# Patient Record
Sex: Female | Born: 1995 | Hispanic: Yes | Marital: Married | Smoking: Never smoker
Health system: Southern US, Community
[De-identification: ages and names within clinical notes are randomized; demographics above are authoritative.]

## PROBLEM LIST (undated history)

## (undated) ENCOUNTER — Inpatient Hospital Stay: Payer: Self-pay

## (undated) DIAGNOSIS — G43909 Migraine, unspecified, not intractable, without status migrainosus: Secondary | ICD-10-CM

## (undated) HISTORY — PX: OTHER SURGICAL HISTORY: SHX169

## (undated) HISTORY — DX: Migraine, unspecified, not intractable, without status migrainosus: G43.909

---
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
## 2014-06-30 NOTE — H&P (Signed)
   Subjective/Chief Complaint Boil/Abscess   History of Present Illness The pt is a previously healthy 19 year old female with no significant past medical history who initially presented to medical attention the day prior to admission. At that [facility] a painful lump in her right groin. She was diagnosed with a boil and treated with po Septra.  On the day of admission her symptoms became much worse. The area had swollen more and become exquisitely painful. The patient presented to after hours clinic and the decision was made to admit her for IV antibiotics and possible surgical incision and drainage.   Past History No prior hospitalizations No chronic medications NKDA   ALLERGIES:  No Known Allergies:   Family and Social History:  Family History Non-Contributory   Place of Living Home   Review of Systems:  Fever/Chills No  No temperature taken at home   Cough No   Abdominal Pain No   Diarrhea No   Nausea/Vomiting No   Tolerating Diet Yes   Medications/Allergies Reviewed Medications/Allergies reviewed   Physical Exam:  GEN well developed, well nourished   HEENT pink conjunctivae, PERRL, moist oral mucosa   NECK supple   RESP normal resp effort  clear BS   CARD regular rate  no murmur   ABD denies tenderness   GU right labia with large (approx 6cm) mass exquisitely tender to touch. Minimal induration/erythema. Unable to determine fluctance due to pts discomfort with exam.    Assessment/Admission Diagnosis Abscess-inguinal region/labia   Plan Admit for IV clindamycin Consult general surgery regarding possible I and D Warm compresses Pain control.   Electronic Signatures: Tammy SoursBailey, Scott (MD)  (Signed 16-May-14 19:36)  Authored: CHIEF COMPLAINT and HISTORY, ALLERGIES, FAMILY AND SOCIAL HISTORY, REVIEW OF SYSTEMS, PHYSICAL EXAM, ASSESSMENT AND PLAN   Last Updated: 16-May-14 19:36 by Tammy SoursBailey, Scott (MD)

## 2014-06-30 NOTE — Discharge Summary (Signed)
Dates of Admission and Diagnosis:  Date of Admission 23-Jul-2012   Date of Discharge 01-Jan-0001   Admitting Diagnosis abcess   Final Diagnosis abcess    Chief Complaint/History of Present Illness 19 yo seen in office 1  day prior to admission and diagnosed with abcess not fluctuant at the time.  Pt was placed on septra.  She presented to the office on the day of admission with worsening symptoms and was admitted for iv ab and possible surgical i&d.   Hospital Course:  Hospital Course The patient admitted and placed on IV clindamycin, spontaneous drainage was noted shortly after admission.  Surgical consult  was obtained but patient has not needed surgical drainage.  She has improved and the wound is open but no longer draining.   Condition on Discharge Satisfactory   DISCHARGE INSTRUCTIONS HOME MEDS:  Medication Reconciliation: Patient's Home Medications at Discharge:     Medication Instructions  motrin  600 mg eery 6 hours at home as needed for pain   cleocin hcl 300 mg oral capsule  1 cap(s) orally 3 times a day    PRESCRIPTIONS: ELECTRONICALLY SUBMITTED   Physician's Instructions:  Home Health? No   Treatments warm soaks tid   Home Oxygen? No   Diet Regular   Activity Limitations As tolerated   Return to Work Not Applicable   Time frame for Follow Up Appointment 1-2 weeks   Electronic Signatures: Charlton Amorarroll, Glenisha Gundry N (MD)  (Signed 18-May-14 09:56)  Authored: ADMISSION DATE AND DIAGNOSIS, CHIEF COMPLAINT/HPI, HOSPITAL COURSE, DISCHARGE INSTRUCTIONS HOME MEDS, PATIENT INSTRUCTIONS Shelva MajesticWest, Mellody LifeKathryn A Banker(RN)  (Signed 18-May-14 10:11)  Authored: ADMISSION DATE AND DIAGNOSIS, DISCHARGE INSTRUCTIONS HOME MEDS   Last Updated: 18-May-14 10:11 by Quillian QuinceWest, Kathryn A (RN)

## 2014-06-30 NOTE — Consult Note (Signed)
PATIENT NAME:  Carly Henry, Braxtyn MR#:  [id number] DATE OF BIRTH:  [birth year]  DATE OF CONSULTATION:  07/23/2012  REFERRING PHYSICIAN:  Scott A. Fredric MareBailey, MD CONSULTING PHYSICIAN:  Quentin Orealph L. Ely III, MD PRIMARY CARE PHYSICIAN: [facility].   CHIEF COMPLAINT: Groin pain with infection.   BRIEF HISTORY: Carly Henry is a 19 year old girl with a 3-day history of increasing swelling and discomfort in her right groin extending up from her perirectal area along her labia. This young woman has had previous similar problem which did not require surgical intervention and drained spontaneously. She noted  this evening that she did have a small amount of drainage from the abscess when she was being moved to the hospital bed.   PAST MEDICAL HISTORY: She has no other major medical problems.   PAST SURGICAL HISTORY: She has had no previous surgery.   MEDICATIONS: She takes no medications regularly.   ALLERGIES: Has no medical allergies.   FAMILY HISTORY: Denies any congenital or chronic conditions.   REVIEW OF SYSTEMS: Unremarkable.   PHYSICAL EXAMINATION: GENERAL: She is an alert, pleasant young woman in moderate distress from pain.  VITAL SIGNS: Stable with a blood pressure of 102/68. Heart rate is 86 and regular. She is afebrile.  HEENT: Unremarkable. No scleral icterus. No pupillary abnormalities. No facial deformities.  NECK: Supple, nontender, with a midline trachea and no adenopathy.  CHEST: Clear with no adventitious sounds. She has normal pulmonary excursion.  CARDIAC: No murmurs or gallops. She seems to be in normal sinus rhythm. ABDOMEN: Benign with no tenderness, no organomegaly. No hernias,  no masses.  RECTOVAGINAL: She does have a 5 to 6 cm erythematous soft area along the right buttock extending up toward the labia. There is a necrotic area in the center which appears to drain spontaneously. There is less fluctuance than noted earlier by the family.  EXTREMITIES:  Extremity exam was otherwise unremarkable with no deformities. Full range of motion.  PSYCHIATRIC: Mild anxiety but normal orientation.   IMPRESSION AND PLAN: We will continue her antibiotics. Nothing by mouth over midnight and  re-evaluate in the morning. If she continues to drain spontaneously, she will not need any surgical intervention, similar to her last episode. If she does have minimal drainage, then we will talk about intraoperative evaluation and possible further drainage with drain placement. This plan has been discussed with the patient and her mother and they are in agreement.   ____________________________ Carmie Endalph L. Ely III, MD rle:jm D: 07/23/2012 22:18:50 ET T: 07/23/2012 23:02:58 ET JOB#: [id number]  cc: Quentin Orealph L. Ely III, MD, <Dictator> Scott A. Fredric MareBailey, MD Quentin OreALPH L ELY MD ELECTRONICALLY SIGNED 07/25/2012 23:05

## 2015-10-11 ENCOUNTER — Ambulatory Visit
Admission: RE | Admit: 2015-10-11 | Discharge: 2015-10-11 | Disposition: A | Discharge status: Home / Self Care | Payer: BLUE CROSS/BLUE SHIELD | Source: Ambulatory Visit | Attending: Physician Assistant | Admitting: Physician Assistant

## 2015-10-11 ENCOUNTER — Other Ambulatory Visit: Payer: Self-pay | Admitting: Physician Assistant

## 2015-10-11 DIAGNOSIS — X58XXXA Exposure to other specified factors, initial encounter: Secondary | ICD-10-CM | POA: Diagnosis not present

## 2015-10-11 DIAGNOSIS — S86911A Strain of unspecified muscle(s) and tendon(s) at lower leg level, right leg, initial encounter: Secondary | ICD-10-CM

## 2016-01-25 ENCOUNTER — Ambulatory Visit (INDEPENDENT_AMBULATORY_CARE_PROVIDER_SITE_OTHER): Payer: BLUE CROSS/BLUE SHIELD | Admitting: Neurology

## 2016-01-25 ENCOUNTER — Encounter: Payer: Self-pay | Admitting: Neurology

## 2016-01-25 VITALS — BP 107/67 | HR 66 | Ht 62.0 in | Wt 180.6 lb

## 2016-01-25 DIAGNOSIS — F411 Generalized anxiety disorder: Secondary | ICD-10-CM | POA: Diagnosis not present

## 2016-01-25 DIAGNOSIS — G43709 Chronic migraine without aura, not intractable, without status migrainosus: Secondary | ICD-10-CM

## 2016-01-25 MED ORDER — SUMATRIPTAN SUCCINATE 100 MG PO TABS: 100.0000 mg | ORAL_TABLET | Freq: Once | ORAL | 12 refills | Status: DC | PRN

## 2016-01-25 MED ORDER — PROCHLORPERAZINE MALEATE 10 MG PO TABS: 10.0000 mg | ORAL_TABLET | Freq: Four times a day (QID) | ORAL | 6 refills | Status: DC | PRN

## 2016-01-25 MED ORDER — TOPIRAMATE 25 MG PO TABS: 100.0000 mg | ORAL_TABLET | Freq: Every day | ORAL | 6 refills | Status: DC
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
## 2016-01-25 NOTE — Patient Instructions (Addendum)
Remember to drink plenty of fluid, eat healthy meals and do not skip any meals. Try to eat protein with a every meal and eat a healthy snack such as fruit or nuts in between meals. Try to keep a regular sleep-wake schedule and try to exercise daily, particularly in the form of walking, 20-30 minutes a day, if you can.   As far as your medications are concerned, I would like to suggest  Topiramate; Start with one pill at bedtime, then increase to 2 pills at bedtime for one week, then increase to 3 pills for one week and then to 4 pills (100mg ) at night before bedtime At onset of headache take Imitrex(Sumatriptan) and Compazine, may repeat once in 2 hours. Please take Imitrex at the onset of your headache. If it does not improve the symptoms please take once additionally. Do not take more then 2 times in 24hrs. Do not take use more then 2 to 3 times in a week.  As far as diagnostic testing: If headaches persist may consider brain imaging  Our phone number is [phone]. We also have an after hours call service for urgent matters and there is a physician on-call for urgent questions. For any emergencies you know to call 911 or go to the nearest emergency room  Topiramate tablets What is this medicine? TOPIRAMATE (toe PYRE a mate) is used to treat seizures in adults or children with epilepsy. It is also used for the prevention of migraine headaches. This medicine may be used for other purposes; ask your health care provider or pharmacist if you have questions. COMMON BRAND NAME(S): Topamax, Topiragen What should I tell my health care provider before I take this medicine? They need to know if you have any of these conditions: -bleeding disorders -cirrhosis of the liver or liver disease -diarrhea -glaucoma -kidney stones or kidney disease -low blood counts, like low white cell, platelet, or red cell counts -lung disease like asthma, obstructive pulmonary disease, emphysema -metabolic  acidosis -on a ketogenic diet -schedule for surgery or a procedure -suicidal thoughts, plans, or attempt; a previous suicide attempt by you or a family member -an unusual or allergic reaction to topiramate, other medicines, foods, dyes, or preservatives -pregnant or trying to get pregnant -breast-feeding How should I use this medicine? Take this medicine by mouth with a glass of water. Follow the directions on the prescription label. Do not crush or chew. You may take this medicine with meals. Take your medicine at regular intervals. Do not take it more often than directed. Talk to your pediatrician regarding the use of this medicine in children. Special care may be needed. While this drug may be prescribed for children as young as 452 years of age for selected conditions, precautions do apply. Overdosage: If you think you have taken too much of this medicine contact a poison control center or emergency room at once. NOTE: This medicine is only for you. Do not share this medicine with others. What if I miss a dose? If you miss a dose, take it as soon as you can. If your next dose is to be taken in less than 6 hours, then do not take the missed dose. Take the next dose at your regular time. Do not take double or extra doses. What may interact with this medicine? Do not take this medicine with any of the following medications: -probenecid This medicine may also interact with the following medications: -acetazolamide -alcohol -amitriptyline -aspirin and aspirin-like medicines -birth control pills -certain  medicines for depression -certain medicines for seizures -certain medicines that treat or prevent blood clots like warfarin, enoxaparin, dalteparin, apixaban, dabigatran, and rivaroxaban -digoxin -hydrochlorothiazide -lithium -medicines for pain, sleep, or muscle relaxation -metformin -methazolamide -NSAIDS, medicines for pain and inflammation, like ibuprofen or  naproxen -pioglitazone -risperidone This list may not describe all possible interactions. Give your health care provider a list of all the medicines, herbs, non-prescription drugs, or dietary supplements you use. Also tell them if you smoke, drink alcohol, or use illegal drugs. Some items may interact with your medicine. What should I watch for while using this medicine? Visit your doctor or health care professional for regular checks on your progress. Do not stop taking this medicine suddenly. This increases the risk of seizures if you are using this medicine to control epilepsy. Wear a medical identification bracelet or chain to say you have epilepsy or seizures, and carry a card that lists all your medicines. This medicine can decrease sweating and increase your body temperature. Watch for signs of deceased sweating or fever, especially in children. Avoid extreme heat, hot baths, and saunas. Be careful about exercising, especially in hot weather. Contact your health care provider right away if you notice a fever or decrease in sweating. You should drink plenty of fluids while taking this medicine. If you have had kidney stones in the past, this will help to reduce your chances of forming kidney stones. If you have stomach pain, with nausea or vomiting and yellowing of your eyes or skin, call your doctor immediately. You may get drowsy, dizzy, or have blurred vision. Do not drive, use machinery, or do anything that needs mental alertness until you know how this medicine affects you. To reduce dizziness, do not sit or stand up quickly, especially if you are an older patient. Alcohol can increase drowsiness and dizziness. Avoid alcoholic drinks. If you notice blurred vision, eye pain, or other eye problems, seek medical attention at once for an eye exam. The use of this medicine may increase the chance of suicidal thoughts or actions. Pay special attention to how you are responding while on this medicine.  Any worsening of mood, or thoughts of suicide or dying should be reported to your health care professional right away. This medicine may increase the chance of developing metabolic acidosis. If left untreated, this can cause kidney stones, bone disease, or slowed growth in children. Symptoms include breathing fast, fatigue, loss of appetite, irregular heartbeat, or loss of consciousness. Call your doctor immediately if you experience any of these side effects. Also, tell your doctor about any surgery you plan on having while taking this medicine since this may increase your risk for metabolic acidosis. Birth control pills may not work properly while you are taking this medicine. Talk to your doctor about using an extra method of birth control. Women who become pregnant while using this medicine may enroll in the [place] [employer] Antiepileptic Drug Pregnancy Registry by calling [phone]. This registry collects information about the safety of antiepileptic drug use during pregnancy. What side effects may I notice from receiving this medicine? Side effects that you should report to your doctor or health care professional as soon as possible: -allergic reactions like skin rash, itching or hives, swelling of the face, lips, or tongue -decreased sweating and/or rise in body temperature -depression -difficulty breathing, fast or irregular breathing patterns -difficulty speaking -difficulty walking or controlling muscle movements -hearing impairment -redness, blistering, peeling or loosening of the skin, including inside the mouth -tingling, pain  or numbness in the hands or feet -unusual bleeding or bruising -unusually weak or tired -worsening of mood, thoughts or actions of suicide or dying Side effects that usually do not require medical attention (report to your doctor or health care professional if they continue or are bothersome): -altered taste -back pain, joint or muscle aches and  pains -diarrhea, or constipation -headache -loss of appetite -nausea -stomach upset, indigestion -tremors This list may not describe all possible side effects. Call your doctor for medical advice about side effects. You may report side effects to FDA at 1-800-FDA-1088. Where should I keep my medicine? Keep out of the reach of children. Store at room temperature between 15 and 30 degrees C (59 and 86 degrees F) in a tightly closed container. Protect from moisture. Throw away any unused medicine after the expiration date. NOTE: This sheet is a summary. It may not cover all possible information. If you have questions about this medicine, talk to your doctor, pharmacist, or health care provider.  2017 Elsevier/Gold Standard (2013-02-28 23:17:57) Naproxen; Sumatriptan tablets What is this medicine? NAPROXEN; SUMATRIPTAN (na PROS en; soo ma TRIP tan) is used to treat migraines with or without aura. An aura is a strange feeling or visual disturbance that warns you of an attack. This medicine is not used to prevent migraines. COMMON BRAND NAME(S): Treximet What should I tell my health care provider before I take this medicine? They need to know if you have any of these conditions: -angina -diabetes -heart disease -hemiplegic or basilar migraine -high blood pressure -high cholesterol -if you frequently drink alcohol containing drinks -irregular heartbeat -ischemic bowel disease -kidney disease -liver disease -low blood counts, like low white cell, platelet, or red cell counts -lung or breathing disease, like asthma -peripheral vascular disease like intermittent claudication -seizures -stomach problems -stroke -take medicines that treat or prevent blood clots -taken an MAOI like Carbex, Eldepryl, Marplan, Nardil, or Parnate in last 14 days -an unusual or allergic reaction to Naproxen; Sumatriptan, other medicines, foods, dyes, or preservatives -pregnant or trying to get  pregnant -breast-feeding How should I use this medicine? Take this medicine by mouth with a glass of water. Follow the directions on the prescription label. You can take it with or without food. If it upsets your stomach, take it with food. Do not cut, crush, or chew this medicine. Do not take it more often than directed. A special MedGuide will be given to you by the pharmacist with each prescription and refill. Be sure to read this information carefully each time. Talk to your pediatrician regarding the use of this medicine in children. While this drug may be prescribed for children as young as 12 for selected conditions, precautions do apply. What if I miss a dose? This does not apply; this medicine is not for regular use. What may interact with this medicine? What drug(s) may interact with Naproxen; Sumatriptan? Do not take this medicine with any of the following medications: -amphetamine -cidofovir -cocaine -dextroamphetamine -ergot alkaloids like dihydroergotamine, ergonovine, ergotamine, methylergonovine -feverfew -ketorolac -MAOIs like Carbex, Eldepryl, Marplan, Nardil, and Parnate -medicines for migraine headache like almotriptan, eletriptan, frovatriptan, naratriptan, rizatriptan, sumatriptan, zolmitriptan -tryptophan This medicine may also interact with the following medications: -alcohol -angiotensin-converting enzyme inhibitors (ACE inhibitors) like captopril, enalapril, lisinopril, and ramipril -aspirin and aspirin-like medicines -diuretics -duloxetine -lithium -medicines for blood pressure like amlodipine, felodipine, nifedipine -medicines that treat or prevent blood clots like warfarin, enoxaparin, and dalteparin -methotrexate -NSAIDS, medicines for pain and inflammation, like ibuprofen or naproxen -  probenecid -selective serotonin reuptake inhibitors such as citalopram, escitalopram, fluoxetine, fluvoxamine, paroxetine, and sertraline -steroid medicines like  prednisone or cortisone -venlafaxine What should I watch for while using this medicine? Only use this medicine for a migraine headache. Use it if you get warning symptoms or at the start of a migraine attack. This medicine is not for regular use to prevent migraine headaches. Tell your doctor or health care professional if your pain does not get better. Talk to your doctor before taking another medicine for pain. Do not treat yourself. You may get drowsy or dizzy. Do not drive, use machinery, or do anything that needs mental alertness until you know how this medicine affects you. Do not stand or sit up quickly, especially if you are an older patient. This reduces the risk of dizzy or fainting spells. Alcohol may interfere with the effect of this medicine. Avoid alcoholic drinks. This medicine may increase your risk to bruise or bleed. Call your doctor or health care professional if you notice any unusual bleeding. Do not take other medicines that contain aspirin, ibuprofen, or naproxen with this medicine. Side effects such as stomach upset, nausea, or ulcers may be more likely to occur. Many medicines available without a prescription should not be taken with this medicine. This medicine does not prevent heart attack or stroke. In fact, this medicine may increase the chance of a heart attack or stroke. The chance may increase with longer use of this medicine and in people who have heart disease. If you take aspirin to prevent heart attack or stroke, talk with your doctor or health care professional. This medicine can cause ulcers and bleeding in the stomach and intestines at any time during treatment. Do not smoke cigarettes or drink alcohol. These increase irritation to your stomach and can make it more susceptible to damage from this medicine. Ulcers and bleeding can happen without warning symptoms and can cause death. This medicine can cause you to bleed more easily. Try to avoid damage to your teeth and  gums when you brush or floss your teeth. If you have any dental work done, tell your dentist you are receiving this medicine. If you take migraine medicines for 10 or more days a month, your migraines may get worse. Keep a diary of headache days and medicine use. Contact your healthcare professional if your migraine attacks occur more frequently. What side effects may I notice from receiving this medicine? Side effects that you should report to your doctor or health care professional as soon as possible: -allergic reactions like skin rash, itching or hives, swelling of the face, lips, or tongue -fast, irregular heartbeat -feeling of chest heaviness or pressure -general ill feeling or flu-like symptoms -redness, blistering, peeling or loosening of the skin, including inside the mouth -seizures -signs and symptoms of bleeding such as bloody or black, tarry stools; red or dark-brown urine; spitting up blood or brown material that looks like coffee grounds; red spots on the skin; unusual bruising or bleeding from the eye, gums, or nose -signs and symptoms of a blood clot such as breathing problems; changes in vision; chest pain; severe, sudden headache; pain, swelling, warmth in the leg; trouble speaking; sudden numbness or weakness of the face, arm or leg -swelling of the ankles, feet, hands -severe stomach pain -tingling, pain, or numbness in the face, hands or feet -unusually weak or tired -yellowing of eyes or skin Side effects that usually do not require medical attention (report to your doctor or health  care professional if they continue or are bothersome): -dry mouth -headache -heartburn -nausea, vomiting Where should I keep my medicine? Keep out of the reach of children. Store at room temperature between 15 and 30 degrees C (59 and 86 degrees F). Throw away any unused medicine after the expiration date.  2017 Elsevier/Gold Standard (2013-07-28 14:55:46) Prochlorperazine tablets What  is this medicine? PROCHLORPERAZINE (proe klor PER a zeen) helps to control severe nausea and vomiting. This medicine is also used to treat schizophrenia. It can also help patients who experience anxiety that is not due to psychological illness. This medicine may be used for other purposes; ask your health care provider or pharmacist if you have questions. COMMON BRAND NAME(S): Compazine What should I tell my health care provider before I take this medicine? They need to know if you have any of these conditions: -blood disorders or disease -dementia -liver disease or jaundice -Parkinson's disease -uncontrollable movement disorder -an unusual or allergic reaction to prochlorperazine, other medicines, foods, dyes, or preservatives -pregnant or trying to get pregnant -breast-feeding How should I use this medicine? Take this medicine by mouth with a glass of water. Follow the directions on the prescription label. Take your doses at regular intervals. Do not take your medicine more often than directed. Do not stop taking this medicine suddenly. This can cause nausea, vomiting, and dizziness. Ask your doctor or health care professional for advice. Talk to your pediatrician regarding the use of this medicine in children. Special care may be needed. While this drug may be prescribed for children as young as 2 years for selected conditions, precautions do apply. Overdosage: If you think you have taken too much of this medicine contact a poison control center or emergency room at once. NOTE: This medicine is only for you. Do not share this medicine with others. What if I miss a dose? If you miss a dose, take it as soon as you can. If it is almost time for your next dose, take only that dose. Do not take double or extra doses. What may interact with this medicine? Do not take this medicine with any of the following medications: -amoxapine -antidepressants like citalopram, escitalopram, fluoxetine,  paroxetine, and sertraline -deferoxamine -dofetilide -maprotiline -tricyclic antidepressants like amitriptyline, clomipramine, imipramine, nortiptyline and others This medicine may also interact with the following medications: -lithium -medicines for pain -phenytoin -propranolol -warfarin This list may not describe all possible interactions. Give your health care provider a list of all the medicines, herbs, non-prescription drugs, or dietary supplements you use. Also tell them if you smoke, drink alcohol, or use illegal drugs. Some items may interact with your medicine. What should I watch for while using this medicine? Visit your doctor or health care professional for regular checks on your progress. You may get drowsy or dizzy. Do not drive, use machinery, or do anything that needs mental alertness until you know how this medicine affects you. Do not stand or sit up quickly, especially if you are an older patient. This reduces the risk of dizzy or fainting spells. Alcohol may interfere with the effect of this medicine. Avoid alcoholic drinks. This medicine can reduce the response of your body to heat or cold. Dress warm in cold weather and stay hydrated in hot weather. If possible, avoid extreme temperatures like saunas, hot tubs, very hot or cold showers, or activities that can cause dehydration such as vigorous exercise. This medicine can make you more sensitive to the sun. Keep out of the sun. If  you cannot avoid being in the sun, wear protective clothing and use sunscreen. Do not use sun lamps or tanning beds/booths. Your mouth may get dry. Chewing sugarless gum or sucking hard candy, and drinking plenty of water may help. Contact your doctor if the problem does not go away or is severe. What side effects may I notice from receiving this medicine? Side effects that you should report to your doctor or health care professional as soon as possible: -blurred vision -breast enlargement in men  or women -breast milk in women who are not breast-feeding -chest pain, fast or irregular heartbeat -confusion, restlessness -dark yellow or brown urine -difficulty breathing or swallowing -dizziness or fainting spells -drooling, shaking, movement difficulty (shuffling walk) or rigidity -fever, chills, sore throat -involuntary or uncontrollable movements of the eyes, mouth, head, arms, and legs -seizures -stomach area pain -unusually weak or tired -unusual bleeding or bruising -yellowing of skin or eyes Side effects that usually do not require medical attention (report to your doctor or health care professional if they continue or are bothersome): -difficulty passing urine -difficulty sleeping -headache -sexual dysfunction -skin rash, or itching This list may not describe all possible side effects. Call your doctor for medical advice about side effects. You may report side effects to FDA at 1-800-FDA-1088. Where should I keep my medicine? Keep out of the reach of children. Store at room temperature between 15 and 30 degrees C (59 and 86 degrees F). Protect from light. Throw away any unused medicine after the expiration date. NOTE: This sheet is a summary. It may not cover all possible information. If you have questions about this medicine, talk to your doctor, pharmacist, or health care provider.  2017 Elsevier/Gold Standard (2011-07-15 16:59:39)

## 2016-01-26 ENCOUNTER — Encounter: Payer: Self-pay | Admitting: Neurology

## 2016-01-26 DIAGNOSIS — F411 Generalized anxiety disorder: Secondary | ICD-10-CM | POA: Insufficient documentation

## 2016-01-26 DIAGNOSIS — G43709 Chronic migraine without aura, not intractable, without status migrainosus: Secondary | ICD-10-CM | POA: Insufficient documentation

## 2016-05-01 ENCOUNTER — Ambulatory Visit: Payer: BLUE CROSS/BLUE SHIELD | Admitting: Adult Health

## 2016-05-05 ENCOUNTER — Encounter: Payer: Self-pay | Admitting: Adult Health

## 2016-09-16 ENCOUNTER — Ambulatory Visit (INDEPENDENT_AMBULATORY_CARE_PROVIDER_SITE_OTHER): Payer: BLUE CROSS/BLUE SHIELD | Admitting: Certified Nurse Midwife

## 2016-09-16 ENCOUNTER — Encounter: Payer: Self-pay | Admitting: Certified Nurse Midwife

## 2016-09-16 VITALS — BP 114/73 | HR 77 | Ht 61.5 in | Wt 189.5 lb

## 2016-09-16 DIAGNOSIS — N926 Irregular menstruation, unspecified: Secondary | ICD-10-CM

## 2016-09-16 DIAGNOSIS — Z3201 Encounter for pregnancy test, result positive: Secondary | ICD-10-CM

## 2016-09-16 LAB — POCT URINE PREGNANCY: Preg Test, Ur: POSITIVE — AB

## 2016-09-16 NOTE — Patient Instructions (Signed)
Common Medications Safe in Pregnancy  Acne:      Constipation:  Benzoyl Peroxide     Colace  Clindamycin      Dulcolax Suppository  Topica Erythromycin     Fibercon  Salicylic Acid      Metamucil         Miralax AVOID:        Senakot   Accutane    Cough:  Retin-A       Cough Drops  Tetracycline      Phenergan w/ Codeine if Rx  Minocycline      Robitussin (Plain & DM)  Antibiotics:     Crabs/Lice:  Ceclor       RID  Cephalosporins    AVOID:  E-Mycins      Kwell  Keflex  Macrobid/Macrodantin   Diarrhea:  Penicillin      Kao-Pectate  Zithromax      Imodium AD         PUSH FLUIDS AVOID:       Cipro     Fever:  Tetracycline      Tylenol (Regular or Extra  Minocycline       Strength)  Levaquin      Extra Strength-Do not          Exceed 8 tabs/24 hrs Caffeine:        <200mg/day (equiv. To 1 cup of coffee or  approx. 3 12 oz sodas)         Gas: Cold/Hayfever:       Gas-X  Benadryl      Mylicon  Claritin       Phazyme  **Claritin-D        Chlor-Trimeton    Headaches:  Dimetapp      ASA-Free Excedrin  Drixoral-Non-Drowsy     Cold Compress  Mucinex (Guaifenasin)     Tylenol (Regular or Extra  Sudafed/Sudafed-12 Hour     Strength)  **Sudafed PE Pseudoephedrine   Tylenol Cold & Sinus     Vicks Vapor Rub  Zyrtec  **AVOID if Problems With Blood Pressure         Heartburn: Avoid lying down for at least 1 hour after meals  Aciphex      Maalox     Rash:  Milk of Magnesia     Benadryl    Mylanta       1% Hydrocortisone Cream  Pepcid  Pepcid Complete   Sleep Aids:  Prevacid      Ambien   Prilosec       Benadryl  Rolaids       Chamomile Tea  Tums (Limit 4/day)     Unisom  Zantac       Tylenol PM         Warm milk-add vanilla or  Hemorrhoids:       Sugar for taste  Anusol/Anusol H.C.  (RX: Analapram 2.5%)  Sugar Substitutes:  Hydrocortisone OTC     Ok in moderation  Preparation H      Tucks        Vaseline lotion applied to tissue with  wiping    Herpes:     Throat:  Acyclovir      Oragel  Famvir  Valtrex     Vaccines:         Flu Shot Leg Cramps:       *Gardasil  Benadryl      Hepatitis A         Hepatitis B Nasal Spray:         Pneumovax  Saline Nasal Spray     Polio Booster         Tetanus Nausea:       Tuberculosis test or PPD  Vitamin B6 25 mg TID   AVOID:    Dramamine      *Gardasil  Emetrol       Live Poliovirus  Ginger Root 250 mg QID    MMR (measles, mumps &  High Complex Carbs @ Bedtime    rebella)  Sea Bands-Accupressure    Varicella (Chickenpox)  Unisom 1/2 tab TID     *No known complications           If received before Pain:         Known pregnancy;   Darvocet       Resume series after  Lortab        Delivery  Percocet    Yeast:   Tramadol      Femstat  Tylenol 3      Gyne-lotrimin  Ultram       Monistat  Vicodin           MISC:         All Sunscreens           Hair Coloring/highlights          Insect Repellant's          (Including DEET)         Mystic Tans WHAT OB PATIENTS CAN EXPECT   Confirmation of pregnancy and ultrasound ordered if medically indicated-[redacted] weeks gestation  New OB (NOB) intake with nurse and New OB (NOB) labs- [redacted] weeks gestation  New OB (NOB) physical examination with provider- 11/[redacted] weeks gestation  Flu vaccine-[redacted] weeks gestation  Anatomy scan-[redacted] weeks gestation  Glucose tolerance test, blood work to test for anemia, T-dap vaccine-[redacted] weeks gestation  Vaginal swabs/cultures-STD/Group B strep-[redacted] weeks gestation  Appointments every 4 weeks until 28 weeks  Every 2 weeks from 28 weeks until 36 weeks  Weekly visits from 36 weeks until delivery  Prenatal Care WHAT IS PRENATAL CARE? Prenatal care is the process of caring for a pregnant woman before she gives birth. Prenatal care makes sure that she and her baby remain as healthy as possible throughout pregnancy. Prenatal care may be provided by a midwife, family practice health care provider, or a childbirth and  pregnancy specialist (obstetrician). Prenatal care may include physical examinations, testing, treatments, and education on nutrition, lifestyle, and social support services. WHY IS PRENATAL CARE SO IMPORTANT? Early and consistent prenatal care increases the chance that you and your baby will remain healthy throughout your pregnancy. This type of care also decreases a baby's risk of being born too early (prematurely), or being born smaller than expected (small for gestational age). Any underlying medical conditions you may have that could pose a risk during your pregnancy are discussed during prenatal care visits. You will also be monitored regularly for any new conditions that may arise during your pregnancy so they can be treated quickly and effectively. WHAT HAPPENS DURING PRENATAL CARE VISITS? Prenatal care visits may include the following: Discussion Tell your health care provider about any new signs or symptoms you have experienced since your last visit. These might include:  Nausea or vomiting.  Increased or decreased level of energy.  Difficulty sleeping.  Back or leg pain.  Weight changes.  Frequent urination.  Shortness of breath with physical activity.  Changes in your skin, such as the development of a rash  or itchiness.  Vaginal discharge or bleeding.  Feelings of excitement or nervousness.  Changes in your baby's movements.  You may want to write down any questions or topics you want to discuss with your health care provider and bring them with you to your appointment. Examination During your first prenatal care visit, you will likely have a complete physical exam. Your health care provider will often examine your vagina, cervix, and the position of your uterus, as well as check your heart, lungs, and other body systems. As your pregnancy progresses, your health care provider will measure the size of your uterus and your baby's position inside your uterus. He or she may  also examine you for early signs of labor. Your prenatal visits may also include checking your blood pressure and, after about 10-12 weeks of pregnancy, listening to your baby's heartbeat. Testing Regular testing often includes:  Urinalysis. This checks your urine for glucose, protein, or signs of infection.  Blood count. This checks the levels of white and red blood cells in your body.  Tests for sexually transmitted infections (STIs). Testing for STIs at the beginning of pregnancy is routinely done and is required in many states.  Antibody testing. You will be checked to see if you are immune to certain illnesses, such as rubella, that can affect a developing fetus.  Glucose screen. Around 24-28 weeks of pregnancy, your blood glucose level will be checked for signs of gestational diabetes. Follow-up tests may be recommended.  Group B strep. This is a bacteria that is commonly found inside a woman's vagina. This test will inform your health care provider if you need an antibiotic to reduce the amount of this bacteria in your body prior to labor and childbirth.  Ultrasound. Many pregnant women undergo an ultrasound screening around 18-20 weeks of pregnancy to evaluate the health of the fetus and check for any developmental abnormalities.  HIV (human immunodeficiency virus) testing. Early in your pregnancy, you will be screened for HIV. If you are at high risk for HIV, this test may be repeated during your third trimester of pregnancy.  You may be offered other testing based on your age, personal or family medical history, or other factors. HOW OFTEN SHOULD I PLAN TO SEE MY HEALTH CARE PROVIDER FOR PRENATAL CARE? Your prenatal care check-up schedule depends on any medical conditions you have before, or develop during, your pregnancy. If you do not have any underlying medical conditions, you will likely be seen for checkups:  Monthly, during the first 6 months of pregnancy.  Twice a month  during months 7 and 8 of pregnancy.  Weekly starting in the 9th month of pregnancy and until delivery.  If you develop signs of early labor or other concerning signs or symptoms, you may need to see your health care provider more often. Ask your health care provider what prenatal care schedule is best for you. WHAT CAN I DO TO KEEP MYSELF AND MY BABY AS HEALTHY AS POSSIBLE DURING MY PREGNANCY?  Take a prenatal vitamin containing 400 micrograms (0.4 mg) of folic acid every day. Your health care provider may also ask you to take additional vitamins such as iodine, vitamin D, iron, copper, and zinc.  Take 1500-2000 mg of calcium daily starting at your 20th week of pregnancy until you deliver your baby.  Make sure you are up to date on your vaccinations. Unless directed otherwise by your health care provider: ? You should receive a tetanus, diphtheria, and pertussis (Tdap) vaccination  between the 27th and 36th week of your pregnancy, regardless of when your last Tdap immunization occurred. This helps protect your baby from whooping cough (pertussis) after he or she is born. ? You should receive an annual inactivated influenza vaccine (IIV) to help protect you and your baby from influenza. This can be done at any point during your pregnancy.  Eat a well-rounded diet that includes: ? Fresh fruits and vegetables. ? Lean proteins. ? Calcium-rich foods such as milk, yogurt, hard cheeses, and dark, leafy greens. ? Whole grain breads.  Do noteat seafood high in mercury, including: ? Swordfish. ? Tilefish. ? Shark. ? King mackerel. ? More than 6 oz tuna per week.  Do not eat: ? Raw or undercooked meats or eggs. ? Unpasteurized foods, such as soft cheeses (brie, blue, or feta), juices, and milks. ? Lunch meats. ? Hot dogs that have not been heated until they are steaming.  Drink enough water to keep your urine clear or pale yellow. For many women, this may be 10 or more 8 oz glasses of water  each day. Keeping yourself hydrated helps deliver nutrients to your baby and may prevent the start of pre-term uterine contractions.  Do not use any tobacco products including cigarettes, chewing tobacco, or electronic cigarettes. If you need help quitting, ask your health care provider.  Do not drink beverages containing alcohol. No safe level of alcohol consumption during pregnancy has been determined.  Do not use any illegal drugs. These can harm your developing baby or cause a miscarriage.  Ask your health care provider or pharmacist before taking any prescription or over-the-counter medicines, herbs, or supplements.  Limit your caffeine intake to no more than 200 mg per day.  Exercise. Unless told otherwise by your health care provider, try to get 30 minutes of moderate exercise most days of the week. Do not  do high-impact activities, contact sports, or activities with a high risk of falling, such as horseback riding or downhill skiing.  Get plenty of rest.  Avoid anything that raises your body temperature, such as hot tubs and saunas.  If you own a cat, do not empty its litter box. Bacteria contained in cat feces can cause an infection called toxoplasmosis. This can result in serious harm to the fetus.  Stay away from chemicals such as insecticides, lead, mercury, and cleaning or paint products that contain solvents.  Do not have any X-rays taken unless medically necessary.  Take a childbirth and breastfeeding preparation class. Ask your health care provider if you need a referral or recommendation.  This information is not intended to replace advice given to you by your health care provider. Make sure you discuss any questions you have with your health care provider. Document Released: 02/27/2003 Document Revised: 07/30/2015 Document Reviewed: 05/11/2013 Elsevier Interactive Patient Education  2017 [employer]. Eating Plan for Pregnant Women While you are pregnant, your body  will require additional nutrition to help support your growing baby. It is recommended that you consume:  150 additional calories each day during your first trimester.  300 additional calories each day during your second trimester.  300 additional calories each day during your third trimester.  Eating a healthy, well-balanced diet is very important for your health and for your baby's health. You also have a higher need for some vitamins and minerals, such as folic acid, calcium, iron, and vitamin D. What do I need to know about eating during pregnancy?  Do not try to lose weight or  go on a diet during pregnancy.  Choose healthy, nutritious foods. Choose  of a sandwich with a glass of milk instead of a candy bar or a high-calorie sugar-sweetened beverage.  Limit your overall intake of foods that have "empty calories." These are foods that have little nutritional value, such as sweets, desserts, candies, sugar-sweetened beverages, and fried foods.  Eat a variety of foods, especially fruits and vegetables.  Take a prenatal vitamin to help meet the additional needs during pregnancy, specifically for folic acid, iron, calcium, and vitamin D.  Remember to stay active. Ask your health care provider for exercise recommendations that are specific to you.  Practice good food safety and cleanliness, such as washing your hands before you eat and after you prepare raw meat. This helps to prevent foodborne illnesses, such as listeriosis, that can be very dangerous for your baby. Ask your health care provider for more information about listeriosis. What does 150 extra calories look like? Healthy options for an additional 150 calories each day could be any of the following:  Plain low-fat yogurt (6-8 oz) with  cup of berries.  1 apple with 2 teaspoons of peanut butter.  Cut-up vegetables with  cup of hummus.  Low-fat chocolate milk (8 oz or 1 cup).  1 string cheese with 1 medium orange.   of  a peanut butter and jelly sandwich on whole-wheat bread (1 tsp of peanut butter).  For 300 calories, you could eat two of those healthy options each day. What is a healthy amount of weight to gain? The recommended amount of weight for you to gain is based on your pre-pregnancy BMI. If your pre-pregnancy BMI was:  Less than 18 (underweight), you should gain 28-40 lb.  18-24.9 (normal), you should gain 25-35 lb.  25-29.9 (overweight), you should gain 15-25 lb.  Greater than 30 (obese), you should gain 11-20 lb.  What if I am having twins or multiples? Generally, pregnant women who will be having twins or multiples may need to increase their daily calories by 300-600 calories each day. The recommended range for total weight gain is 25-54 lb, depending on your pre-pregnancy BMI. Talk with your health care provider for specific guidance about additional nutritional needs, weight gain, and exercise during your pregnancy. What foods can I eat? Grains Any grains. Try to choose whole grains, such as whole-wheat bread, oatmeal, or brown rice. Vegetables Any vegetables. Try to eat a variety of colors and types of vegetables to get a full range of vitamins and minerals. Remember to wash your vegetables well before eating. Fruits Any fruits. Try to eat a variety of colors and types of fruit to get a full range of vitamins and minerals. Remember to wash your fruits well before eating. Meats and Other Protein Sources Lean meats, including chicken, [place], fish, and lean cuts of beef, veal, or pork. Make sure that all meats are cooked to "well done." Tofu. Tempeh. Beans. Eggs. Peanut butter and other nut butters. Seafood, such as shrimp, crab, and lobster. If you choose fish, select types that are higher in omega-3 fatty acids, including salmon, herring, mussels, trout, sardines, and pollock. Make sure that all meats are cooked to food-safe temperatures. Dairy Pasteurized milk and milk alternatives.  Pasteurized yogurt and pasteurized cheese. Cottage cheese. Sour cream. Beverages Water. Juices that contain 100% fruit juice or vegetable juice. Caffeine-free teas and decaffeinated coffee. Drinks that contain caffeine are okay to drink, but it is better to avoid caffeine. Keep your total caffeine intake  to less than 200 mg each day (12 oz of coffee, tea, or soda) or as directed by your health care provider. Condiments Any pasteurized condiments. Sweets and Desserts Any sweets and desserts. Fats and Oils Any fats and oils. The items listed above may not be a complete list of recommended foods or beverages. Contact your dietitian for more options. What foods are not recommended? Vegetables Unpasteurized (raw) vegetable juices. Fruits Unpasteurized (raw) fruit juices. Meats and Other Protein Sources Cured meats that have nitrates, such as bacon, salami, and hotdogs. Luncheon meats, bologna, or other deli meats (unless they are reheated until they are steaming hot). Refrigerated pate, meat spreads from a meat counter, smoked seafood that is found in the refrigerated section of a store. Raw fish, such as sushi or sashimi. High mercury content fish, such as tilefish, shark, swordfish, and king mackerel. Raw meats, such as tuna or beef tartare. Undercooked meats and poultry. Make sure that all meats are cooked to food-safe temperatures. Dairy Unpasteurized (raw) milk and any foods that have raw milk in them. Soft cheeses, such as feta, queso blanco, queso fresco, Brie, Camembert cheeses, blue-veined cheeses, and Panela cheese (unless it is made with pasteurized milk, which must be stated on the label). Beverages Alcohol. Sugar-sweetened beverages, such as sodas, teas, or energy drinks. Condiments Homemade fermented foods and drinks, such as pickles, sauerkraut, or kombucha drinks. (Store-bought pasteurized versions of these are okay.) Other Salads that are made in the store, such as ham salad,  chicken salad, egg salad, tuna salad, and seafood salad. The items listed above may not be a complete list of foods and beverages to avoid. Contact your dietitian for more information. This information is not intended to replace advice given to you by your health care provider. Make sure you discuss any questions you have with your health care provider. Document Released: 12/09/2013 Document Revised: 08/02/2015 Document Reviewed: 08/09/2013 Elsevier Interactive Patient Education  2018 [place] of Pregnancy The first trimester of pregnancy is from week 1 until the end of week 13 (months 1 through 3). A week after a sperm fertilizes an egg, the egg will implant on the wall of the uterus. This embryo will begin to develop into a baby. Genes from you and your partner will form the baby. The female genes will determine whether the baby will be a boy or a girl. At 6-8 weeks, the eyes and face will be formed, and the heartbeat can be seen on ultrasound. At the end of 12 weeks, all the baby's organs will be formed. Now that you are pregnant, you will want to do everything you can to have a healthy baby. Two of the most important things are to get good prenatal care and to follow your health care provider's instructions. Prenatal care is all the medical care you receive before the baby's birth. This care will help prevent, find, and treat any problems during the pregnancy and childbirth. Body changes during your first trimester Your body goes through many changes during pregnancy. The changes vary from woman to woman.  You may gain or lose a couple of pounds at first.  You may feel sick to your stomach (nauseous) and you may throw up (vomit). If the vomiting is uncontrollable, call your health care provider.  You may tire easily.  You may develop headaches that can be relieved by medicines. All medicines should be approved by your health care provider.  You may urinate more often.  Painful urination may  mean you have a bladder infection.  You may develop heartburn as a result of your pregnancy.  You may develop constipation because certain hormones are causing the muscles that push stool through your intestines to slow down.  You may develop hemorrhoids or swollen veins (varicose veins).  Your breasts may begin to grow larger and become tender. Your nipples may stick out more, and the tissue that surrounds them (areola) may become darker.  Your gums may bleed and may be sensitive to brushing and flossing.  Dark spots or blotches (chloasma, mask of pregnancy) may develop on your face. This will likely fade after the baby is born.  Your menstrual periods will stop.  You may have a loss of appetite.  You may develop cravings for certain kinds of food.  You may have changes in your emotions from day to day, such as being excited to be pregnant or being concerned that something may go wrong with the pregnancy and baby.  You may have more vivid and strange dreams.  You may have changes in your hair. These can include thickening of your hair, rapid growth, and changes in texture. Some women also have hair loss during or after pregnancy, or hair that feels dry or thin. Your hair will most likely return to normal after your baby is born.  What to expect at prenatal visits During a routine prenatal visit:  You will be weighed to make sure you and the baby are growing normally.  Your blood pressure will be taken.  Your abdomen will be measured to track your baby's growth.  The fetal heartbeat will be listened to between weeks 10 and 14 of your pregnancy.  Test results from any previous visits will be discussed.  Your health care provider may ask you:  How you are feeling.  If you are feeling the baby move.  If you have had any abnormal symptoms, such as leaking fluid, bleeding, severe headaches, or abdominal cramping.  If you are using any tobacco products,  including cigarettes, chewing tobacco, and electronic cigarettes.  If you have any questions.  Other tests that may be performed during your first trimester include:  Blood tests to find your blood type and to check for the presence of any previous infections. The tests will also be used to check for low iron levels (anemia) and protein on red blood cells (Rh antibodies). Depending on your risk factors, or if you previously had diabetes during pregnancy, you may have tests to check for high blood sugar that affects pregnant women (gestational diabetes).  Urine tests to check for infections, diabetes, or protein in the urine.  An ultrasound to confirm the proper growth and development of the baby.  Fetal screens for spinal cord problems (spina bifida) and Down syndrome.  HIV (human immunodeficiency virus) testing. Routine prenatal testing includes screening for HIV, unless you choose not to have this test.  You may need other tests to make sure you and the baby are doing well.  Follow these instructions at home: Medicines  Follow your health care provider's instructions regarding medicine use. Specific medicines may be either safe or unsafe to take during pregnancy.  Take a prenatal vitamin that contains at least 600 micrograms (mcg) of folic acid.  If you develop constipation, try taking a stool softener if your health care provider approves. Eating and drinking  Eat a balanced diet that includes fresh fruits and vegetables, whole grains, good sources of protein such as meat, eggs, or tofu,  and low-fat dairy. Your health care provider will help you determine the amount of weight gain that is right for you.  Avoid raw meat and uncooked cheese. These carry germs that can cause birth defects in the baby.  Eating four or five small meals rather than three large meals a day may help relieve nausea and vomiting. If you start to feel nauseous, eating a few soda crackers can be helpful.  Drinking liquids between meals, instead of during meals, also seems to help ease nausea and vomiting.  Limit foods that are high in fat and processed sugars, such as fried and sweet foods.  To prevent constipation: ? Eat foods that are high in fiber, such as fresh fruits and vegetables, whole grains, and beans. ? Drink enough fluid to keep your urine clear or pale yellow. Activity  Exercise only as directed by your health care provider. Most women can continue their usual exercise routine during pregnancy. Try to exercise for 30 minutes at least 5 days a week. Exercising will help you: ? Control your weight. ? Stay in shape. ? Be prepared for labor and delivery.  Experiencing pain or cramping in the lower abdomen or lower back is a good sign that you should stop exercising. Check with your health care provider before continuing with normal exercises.  Try to avoid standing for long periods of time. Move your legs often if you must stand in one place for a long time.  Avoid heavy lifting.  Wear low-heeled shoes and practice good posture.  You may continue to have sex unless your health care provider tells you not to. Relieving pain and discomfort  Wear a good support bra to relieve breast tenderness.  Take warm sitz baths to soothe any pain or discomfort caused by hemorrhoids. Use hemorrhoid cream if your health care provider approves.  Rest with your legs elevated if you have leg cramps or low back pain.  If you develop varicose veins in your legs, wear support hose. Elevate your feet for 15 minutes, 3-4 times a day. Limit salt in your diet. Prenatal care  Schedule your prenatal visits by the twelfth week of pregnancy. They are usually scheduled monthly at first, then more often in the last 2 months before delivery.  Write down your questions. Take them to your prenatal visits.  Keep all your prenatal visits as told by your health care provider. This is  important. Safety  Wear your seat belt at all times when driving.  Make a list of emergency phone numbers, including numbers for family, friends, the hospital, and police and fire departments. General instructions  Ask your health care provider for a referral to a local prenatal education class. Begin classes no later than the beginning of month 6 of your pregnancy.  Ask for help if you have counseling or nutritional needs during pregnancy. Your health care provider can offer advice or refer you to specialists for help with various needs.  Do not use hot tubs, steam rooms, or saunas.  Do not douche or use tampons or scented sanitary pads.  Do not cross your legs for long periods of time.  Avoid cat litter boxes and soil used by cats. These carry germs that can cause birth defects in the baby and possibly loss of the fetus by miscarriage or stillbirth.  Avoid all smoking, herbs, alcohol, and medicines not prescribed by your health care provider. Chemicals in these products affect the formation and growth of the baby.  Do not use  any products that contain nicotine or tobacco, such as cigarettes and e-cigarettes. If you need help quitting, ask your health care provider. You may receive counseling support and other resources to help you quit.  Schedule a dentist appointment. At home, brush your teeth with a soft toothbrush and be gentle when you floss. Contact a health care provider if:  You have dizziness.  You have mild pelvic cramps, pelvic pressure, or nagging pain in the abdominal area.  You have persistent nausea, vomiting, or diarrhea.  You have a bad smelling vaginal discharge.  You have pain when you urinate.  You notice increased swelling in your face, hands, legs, or ankles.  You are exposed to fifth disease or chickenpox.  You are exposed to [place] measles (rubella) and have never had it. Get help right away if:  You have a fever.  You are leaking fluid from your  vagina.  You have spotting or bleeding from your vagina.  You have severe abdominal cramping or pain.  You have rapid weight gain or loss.  You vomit blood or material that looks like coffee grounds.  You develop a severe headache.  You have shortness of breath.  You have any kind of trauma, such as from a fall or a car accident. Summary  The first trimester of pregnancy is from week 1 until the end of week 13 (months 1 through 3).  Your body goes through many changes during pregnancy. The changes vary from woman to woman.  You will have routine prenatal visits. During those visits, your health care provider will examine you, discuss any test results you may have, and talk with you about how you are feeling. This information is not intended to replace advice given to you by your health care provider. Make sure you discuss any questions you have with your health care provider. Document Released: 02/18/2001 Document Revised: 02/06/2016 Document Reviewed: 02/06/2016 Elsevier Interactive Patient Education  2017 [employer].

## 2016-09-16 NOTE — Progress Notes (Addendum)
GYN ENCOUNTER NOTE  Subjective:       Carly Henry is a 21 y.o. G1P0 female here for pregnancy confirmation.   She reports positive home pregnancy test and endorses breast tenderness. This is a planned pregnancy.   Denies difficulty breathing or respiratory distress, chest pain, nausea and vomiting, abdominal pain, dysuria, vaginal bleeding, and leg pain or swelling.   History significant for migraines, no currently taking any medications.   Gynecologic History  Patient's last menstrual period was 08/04/2016 (exact date).  Estimated date of birth: 05/11/2018  Gestational age: 81 weeks 1 day  Contraception: none   Last Pap: N/A.   Obstetric History  OB History  Gravida Para Term Preterm AB Living  1            SAB TAB Ectopic Multiple Live Births               # Outcome Date GA Lbr Len/2nd Weight Sex Delivery Anes PTL Lv  1 Current               Past Medical History:  Diagnosis Date  . Migraine     Past Surgical History:  Procedure Laterality Date  . no surgerical history      Current Outpatient Prescriptions on File Prior to Visit  Medication Sig Dispense Refill  . ibuprofen (ADVIL,MOTRIN) 400 MG tablet Take 400 mg by mouth every 6 (six) hours as needed.    . prochlorperazine (COMPAZINE) 10 MG tablet Take 1 tablet (10 mg total) by mouth every 6 (six) hours as needed for nausea or vomiting. (Patient not taking: Reported on 09/16/2016) 30 tablet 6  . SUMAtriptan (IMITREX) 100 MG tablet Take 1 tablet (100 mg total) by mouth once as needed. May repeat in 2 hours if headache persists or recurs. (Patient not taking: Reported on 09/16/2016) 10 tablet 12  . topiramate (TOPAMAX) 25 MG tablet Take 4 tablets (100 mg total) by mouth at bedtime. (Patient not taking: Reported on 09/16/2016) 120 tablet 6   No current facility-administered medications on file prior to visit.     No Known Allergies  Social History   Social History  . Marital status: Single    Spouse  name: N/A  . Number of children: 0  . Years of education: 12+   Occupational History  . College student    Social History Main Topics  . Smoking status: Never Smoker  . Smokeless tobacco: Never Used  . Alcohol use Yes     Comment: Rare  . Drug use: No  . Sexual activity: Yes    Birth control/ protection: None   Other Topics Concern  . Not on file   Social History Narrative  . No narrative on file    Family History  Problem Relation Age of Onset  . Gestational diabetes Mother   . Migraines Mother   . High Cholesterol Mother   . High Cholesterol Father   . Diabetes Maternal Grandmother   . Diabetes Maternal Grandfather     The following portions of the patient's history were reviewed and updated as appropriate: allergies, current medications, past family history, past medical history, past social history, past surgical history and problem list.  Review of Systems  Review of Systems - Negative except as noted above.  History obtained from the patient.  Objective:   BP 114/73   Pulse 77   Ht 5' 1.5" (1.562 m)   Wt 189 lb 8 oz (86 kg)   LMP 08/04/2016 (  Exact Date)   BMI 35.23 kg/m   Alert and oriented x 4, no apparent distress.   Positive UPT  Physical exam: not indicated.  Assessment:   1. Missed menses  - POCT urine pregnancy  2. Positive pregnancy test  Plan:   Encouraged PNV with folic acid and DHA.   Discussed practice policies and course of prenatal care.   Education regarding pregnancy safe foods and medications.   Reviewed red flag symptoms and when to call.   RTC x 3 weeks for nurse intake.   RTC x 6 weeks for NOB Physical.    Gunnar Bulla, CNM  A total of 20 minutes were spent face-to-face with the patient during the encounter with greater than 50% dealing with counseling and coordination of care.

## 2016-09-16 NOTE — Progress Notes (Signed)
Patient ID: Carly Henry, female   DOB: 03/21/1995, 21 y.o.   MRN: [id number] New Pt presents for confirmation of pregnancy. UPT: positive. LMP: 08/04/2016 Exact. EGA: 6.1 wk; EDD: 05/21/2017.

## 2016-09-22 ENCOUNTER — Emergency Department: Payer: BLUE CROSS/BLUE SHIELD

## 2016-09-22 ENCOUNTER — Encounter: Payer: Self-pay | Admitting: Emergency Medicine

## 2016-09-22 ENCOUNTER — Emergency Department
Admission: EM | Admit: 2016-09-22 | Discharge: 2016-09-22 | Disposition: A | Discharge status: Home / Self Care | Payer: BLUE CROSS/BLUE SHIELD | Attending: Emergency Medicine | Admitting: Emergency Medicine

## 2016-09-22 ENCOUNTER — Telehealth: Payer: Self-pay | Admitting: Certified Nurse Midwife

## 2016-09-22 DIAGNOSIS — O26891 Other specified pregnancy related conditions, first trimester: Secondary | ICD-10-CM

## 2016-09-22 DIAGNOSIS — B9689 Other specified bacterial agents as the cause of diseases classified elsewhere: Secondary | ICD-10-CM

## 2016-09-22 DIAGNOSIS — Z3A01 Less than 8 weeks gestation of pregnancy: Secondary | ICD-10-CM | POA: Insufficient documentation

## 2016-09-22 DIAGNOSIS — O2391 Unspecified genitourinary tract infection in pregnancy, first trimester: Secondary | ICD-10-CM | POA: Diagnosis not present

## 2016-09-22 DIAGNOSIS — N760 Acute vaginitis: Secondary | ICD-10-CM

## 2016-09-22 DIAGNOSIS — O9989 Other specified diseases and conditions complicating pregnancy, childbirth and the puerperium: Secondary | ICD-10-CM | POA: Insufficient documentation

## 2016-09-22 DIAGNOSIS — R1031 Right lower quadrant pain: Secondary | ICD-10-CM

## 2016-09-22 DIAGNOSIS — R109 Unspecified abdominal pain: Secondary | ICD-10-CM

## 2016-09-22 DIAGNOSIS — R1030 Lower abdominal pain, unspecified: Secondary | ICD-10-CM | POA: Diagnosis present

## 2016-09-22 LAB — URINALYSIS, COMPLETE (UACMP) WITH MICROSCOPIC
BACTERIA UA: NONE SEEN
Bilirubin Urine: NEGATIVE
Glucose, UA: NEGATIVE mg/dL
KETONES UR: NEGATIVE mg/dL
Leukocytes, UA: NEGATIVE
Nitrite: NEGATIVE
PROTEIN: NEGATIVE mg/dL
Specific Gravity, Urine: 1.023 (ref 1.005–1.030)
pH: 6 (ref 5.0–8.0)

## 2016-09-22 LAB — COMPREHENSIVE METABOLIC PANEL
ALBUMIN: 3.8 g/dL (ref 3.5–5.0)
ALK PHOS: 65 U/L (ref 38–126)
ALT: 19 U/L (ref 14–54)
ANION GAP: 10 (ref 5–15)
AST: 25 U/L (ref 15–41)
BUN: 10 mg/dL (ref 6–20)
CO2: 21 mmol/L — AB (ref 22–32)
Calcium: 9 mg/dL (ref 8.9–10.3)
Chloride: 105 mmol/L (ref 101–111)
Creatinine, Ser: 0.54 mg/dL (ref 0.44–1.00)
GFR calc Af Amer: >60 mL/min (ref >60)
GFR calc non Af Amer: >60 mL/min (ref >60)
GLUCOSE: 113 mg/dL — AB (ref 65–99)
POTASSIUM: 3.3 mmol/L — AB (ref 3.5–5.1)
SODIUM: 136 mmol/L (ref 135–145)
Total Bilirubin: 0.6 mg/dL (ref 0.3–1.2)
Total Protein: 7.4 g/dL (ref 6.5–8.1)

## 2016-09-22 LAB — WET PREP, GENITAL
TRICH WET PREP: NONE SEEN
YEAST WET PREP: NONE SEEN

## 2016-09-22 LAB — CBC
HEMATOCRIT: 39.8 % (ref 35.0–47.0)
HEMOGLOBIN: 13.3 g/dL (ref 12.0–16.0)
MCH: 27.4 pg (ref 26.0–34.0)
MCHC: 33.4 g/dL (ref 32.0–36.0)
MCV: 81.8 fL (ref 80.0–100.0)
Platelets: 276 10*3/uL (ref 150–440)
RBC: 4.87 MIL/uL (ref 3.80–5.20)
RDW: 14.4 % (ref 11.5–14.5)
WBC: 11.6 10*3/uL — ABNORMAL HIGH (ref 3.6–11.0)

## 2016-09-22 LAB — CHLAMYDIA/NGC RT PCR (ARMC ONLY)
Chlamydia Tr: NOT DETECTED
N GONORRHOEAE: NOT DETECTED

## 2016-09-22 LAB — HCG, QUANTITATIVE, PREGNANCY: hCG, Beta Chain, Quant, S: 16543 m[IU]/mL — ABNORMAL HIGH (ref <5)

## 2016-09-22 LAB — LIPASE, BLOOD: Lipase: 28 U/L (ref 11–51)

## 2016-09-22 MED ORDER — METRONIDAZOLE 500 MG PO TABS: 500.0000 mg | ORAL_TABLET | Freq: Two times a day (BID) | ORAL | 0 refills | Status: AC

## 2016-09-22 MED ORDER — METRONIDAZOLE 500 MG PO TABS
500.0000 mg | ORAL_TABLET | Freq: Once | ORAL | Status: AC
  Administered 2016-09-22: 500 mg via ORAL
  Filled 2016-09-22: qty 1

## 2016-09-22 NOTE — ED Provider Notes (Signed)
[facility] Emergency Department Provider Note  ____________________________________________  Time seen: Approximately 1:18 PM  I have reviewed the triage vital signs and the nursing notes.   HISTORY  Chief Complaint Abdominal Pain   HPI Carly Henry is a 21 y.o. female G1P0 at [redacted] weeks GA per LMP presents for evaluation of abdominal pain. Patient reports intermittent cramping/sharp abdominal pain that is located in her lower abdomen radiating to her back and to her bilateral thighs associated with nausea. Pain is intermittent and she has none at this time. She has been seen by OB/GYN last week however has not undergone an ultrasound. No dysuria or hematuria, no vaginal discharge, no vaginal bleeding, no vomiting, no fever or chills, no diarrhea or constipation. No prior abdominal surgeries.  Past Medical History:  Diagnosis Date  . Migraine     Patient Active Problem List   Diagnosis Date Noted  . Chronic migraine w/o aura w/o status migrainosus, not intractable 01/26/2016  . Generalized anxiety disorder 01/26/2016    Past Surgical History:  Procedure Laterality Date  . no surgerical history      Prior to Admission medications   Medication Sig Start Date End Date Taking? Authorizing Provider  ibuprofen (ADVIL,MOTRIN) 400 MG tablet Take 400 mg by mouth every 6 (six) hours as needed.    [provider]  metroNIDAZOLE (FLAGYL) 500 MG tablet Take 1 tablet (500 mg total) by mouth 2 (two) times daily. 09/22/16 09/29/16  Nita Sickle, MD  prochlorperazine (COMPAZINE) 10 MG tablet Take 1 tablet (10 mg total) by mouth every 6 (six) hours as needed for nausea or vomiting. Patient not taking: Reported on 09/16/2016 01/25/16   Anson Fret, MD  SUMAtriptan (IMITREX) 100 MG tablet Take 1 tablet (100 mg total) by mouth once as needed. May repeat in 2 hours if headache persists or recurs. Patient not taking: Reported on 09/16/2016 01/25/16    Anson Fret, MD  topiramate (TOPAMAX) 25 MG tablet Take 4 tablets (100 mg total) by mouth at bedtime. Patient not taking: Reported on 09/16/2016 01/25/16   Anson Fret, MD    Allergies Patient has no known allergies.  Family History  Problem Relation Age of Onset  . Gestational diabetes Mother   . Migraines Mother   . High Cholesterol Mother   . High Cholesterol Father   . Diabetes Maternal Grandmother   . Diabetes Maternal Grandfather     Social History Social History  Substance Use Topics  . Smoking status: Never Smoker  . Smokeless tobacco: Never Used  . Alcohol use Yes     Comment: Rare    Review of Systems  Constitutional: Negative for fever. Eyes: Negative for visual changes. ENT: Negative for sore throat. Neck: No neck pain  Cardiovascular: Negative for chest pain. Respiratory: Negative for shortness of breath. Gastrointestinal: + lower abdominal pain and nausea. No vomiting or diarrhea. Genitourinary: Negative for dysuria. Musculoskeletal: Negative for back pain. Skin: Negative for rash. Neurological: Negative for headaches, weakness or numbness. Psych: No SI or HI  ____________________________________________   PHYSICAL EXAM:  VITAL SIGNS: ED Triage Vitals  Enc Vitals Group     BP 09/22/16 1034 129/63     Pulse Rate 09/22/16 1034 81     Resp 09/22/16 1034 18     Temp 09/22/16 1034 98.2 F (36.8 C)     Temp Source 09/22/16 1034 Oral     SpO2 09/22/16 1034 99 %     Weight 09/22/16  1035 189 lb (85.7 kg)     Height 09/22/16 1035 5' 1.5" (1.562 m)     Head Circumference --      Peak Flow --      Pain Score 09/22/16 1034 8     Pain Loc --      Pain Edu? --      Excl. in GC? --     Constitutional: Alert and oriented. Well appearing and in no apparent distress. HEENT:      Head: Normocephalic and atraumatic.         Eyes: Conjunctivae are normal. Sclera is non-icteric.       Mouth/Throat: Mucous membranes are moist.       Neck:  Supple with no signs of meningismus. Cardiovascular: Regular rate and rhythm. No murmurs, gallops, or rubs. 2+ symmetrical distal pulses are present in all extremities. No JVD. Respiratory: Normal respiratory effort. Lungs are clear to auscultation bilaterally. No wheezes, crackles, or rhonchi.  Gastrointestinal: Soft, mild suprapubic ttp which worse towards to R side but no RLQ ttp, and non distended with positive bowel sounds. No rebound or guarding. Pelvic exam: Normal external genitalia, no rashes or lesions. Normal cervical mucus. Os closed. No cervical motion tenderness.  No uterine or adnexal tenderness.   Musculoskeletal: Nontender with normal range of motion in all extremities. No edema, cyanosis, or erythema of extremities. Neurologic: Normal speech and language. Face is symmetric. Moving all extremities. No gross focal neurologic deficits are appreciated. Skin: Skin is warm, dry and intact. No rash noted. Psychiatric: Mood and affect are normal. Speech and behavior are normal.  ____________________________________________   LABS (all labs ordered are listed, but only abnormal results are displayed)  Labs Reviewed  WET PREP, GENITAL - Abnormal; Notable for the following:       Result Value   Clue Cells Wet Prep HPF POC PRESENT (*)    WBC, Wet Prep HPF POC FEW (*)    All other components within normal limits  COMPREHENSIVE METABOLIC PANEL - Abnormal; Notable for the following:    Potassium 3.3 (*)    CO2 21 (*)    Glucose, Bld 113 (*)    All other components within normal limits  CBC - Abnormal; Notable for the following:    WBC 11.6 (*)    All other components within normal limits  URINALYSIS, COMPLETE (UACMP) WITH MICROSCOPIC - Abnormal; Notable for the following:    Color, Urine YELLOW (*)    APPearance CLEAR (*)    Hgb urine dipstick SMALL (*)    Squamous Epithelial / LPF 0-5 (*)    All other components within normal limits  HCG, QUANTITATIVE, PREGNANCY - Abnormal;  Notable for the following:    hCG, Beta Chain, Quant, S 16,543 (*)    All other components within normal limits  CHLAMYDIA/NGC RT PCR ([facility] ONLY)  LIPASE, BLOOD   ____________________________________________  EKG  none  ____________________________________________  RADIOLOGY  TVUS: 1. Single live intrauterine gestation with an estimated gestational age of [redacted] weeks and 6 days. 2. Small subchorionic hemorrhage. 3. No evidence of ovarian torsion. ____________________________________________   PROCEDURES  Procedure(s) performed: None Procedures Critical Care performed:  None ____________________________________________   INITIAL IMPRESSION / ASSESSMENT AND PLAN / ED COURSE   21 y.o. female G1P0 at [redacted] weeks GA per LMP presents for evaluation of abdominal pain. Patient is well-appearing, in no distress, normal vital signs, she has mild suprapubic tenderness with no rebound or guarding. We'll send patient for a transvaginal  ultrasound to rule out ectopic pregnancy, ovarian torsion, ovarian cyst. No RLQ or RUQ ttp. Labs with no acute findings. UA with no evidence of infection. hcg 16K  Clinical Course as of Sep 23 1455  Mon Sep 22, 2016  1453 Transvaginal ultrasound showing normal IUP measuring 5 weeks and 6 days. Wet prep positive for BV. Patient was started on Flagyl. She has an appointment on August 3 with her OB/GYN. Recommend that she takes prenatal vitamins in the meantime. We'll discharge home at this time.  [CV]    Clinical Course User Index [CV] Nita SickleVeronese, [place], MD    Pertinent labs & imaging results that were available during my care of the patient were reviewed by me and considered in my medical decision making (see chart for details).    ____________________________________________   FINAL CLINICAL IMPRESSION(S) / ED DIAGNOSES  Final diagnoses:  RLQ abdominal pain  Bacterial vaginosis  Abdominal pain during pregnancy in first trimester       NEW MEDICATIONS STARTED DURING THIS VISIT:  New Prescriptions   METRONIDAZOLE (FLAGYL) 500 MG TABLET    Take 1 tablet (500 mg total) by mouth 2 (two) times daily.     Note:  This document was prepared using Dragon voice recognition software and may include unintentional dictation errors.    Don PerkingVeronese, [place], MD 09/22/16 [phone]

## 2016-09-22 NOTE — ED Triage Notes (Signed)
Pt reports low back, low abdominal and leg pain that comes and goes for the past week but is worse today. Pt reports new nausea as well. Denies vomiting or diarrhea. Pt reports she is [redacted] weeks pregnant. Denies vaginal bleeding.

## 2016-09-22 NOTE — ED Notes (Signed)
Pt c/o lower abd pain for the past week and states she is approximately [redacted] weeks pregnant. Was seen by OB last Tuesday. Denies any vaginal bleeding..Marland Kitchen

## 2016-09-22 NOTE — Telephone Encounter (Signed)
Patient lvm, I called back, Patient wanted to inform someone about her ED visit she had over the weekend, Patient was having severe back and abdominal pain, A transvaginal u/s was completed and she was diagnosed with "Bacterial Vaginosis" Patient is on a antibiotic for seven days and the patient did not disclose any other information, I informed the patient that her next appointment is on 10/10/16, and to call us if she feels she needs to be seen sooner, Thank you.

## 2016-10-10 ENCOUNTER — Ambulatory Visit (INDEPENDENT_AMBULATORY_CARE_PROVIDER_SITE_OTHER): Payer: BLUE CROSS/BLUE SHIELD | Admitting: Certified Nurse Midwife

## 2016-10-10 VITALS — BP 104/70 | HR 76 | Ht 61.5 in | Wt 190.4 lb

## 2016-10-10 DIAGNOSIS — E669 Obesity, unspecified: Secondary | ICD-10-CM

## 2016-10-10 DIAGNOSIS — Z113 Encounter for screening for infections with a predominantly sexual mode of transmission: Secondary | ICD-10-CM

## 2016-10-10 DIAGNOSIS — Z3401 Encounter for supervision of normal first pregnancy, first trimester: Secondary | ICD-10-CM | POA: Diagnosis not present

## 2016-10-10 DIAGNOSIS — Z1389 Encounter for screening for other disorder: Secondary | ICD-10-CM | POA: Diagnosis not present

## 2016-10-10 NOTE — Progress Notes (Signed)
Carly Henry presents for NOB nurse interview visit. Pregnancy confirmation done at ER on 09/22/2016 for abdominal pain. An ultrasound was done at ER which confirmed a viable pregnancy of 5.6 wk on 09/22/2016. LMP-08/04/2016, difference of +1w, 1d of EDD.  G-1. P-0000. Pregnancy education material explained and given. No cats in the home. NOB labs ordered. TSH/HbgA1c due to Increased BMI-34.  HIV labs and Drug screen were explained optional and she did not decline. Drug screen ordered. PNV encouraged. Genetic screening options discussed. Genetic testing: unsure. Pt had called her insurance company and they do not cover genetic testing unless warranted.  Pt may discuss with provider further. Pt. To follow up with provider on 10/28/2016 for NOB physical as scheduled.  All questions answered.

## 2016-10-14 NOTE — Progress Notes (Signed)
I have reviewed the record and concur with patient management and plan.    Tammy Ericsson Michelle Mekenna Finau, CNM [facility] 

## 2016-10-15 LAB — RPR

## 2016-10-15 LAB — HEMOGLOBIN A1C

## 2016-10-15 LAB — RUBELLA SCREEN

## 2016-10-15 LAB — CBC WITH DIFFERENTIAL/PLATELET

## 2016-10-15 LAB — VARICELLA ZOSTER ANTIBODY, IGG

## 2016-10-15 LAB — TSH

## 2016-10-15 LAB — ANTIBODY SCREEN

## 2016-10-15 LAB — ABO

## 2016-10-15 LAB — RH TYPE

## 2016-10-15 LAB — HEPATITIS B SURFACE ANTIGEN

## 2016-10-15 LAB — HIV ANTIBODY (ROUTINE TESTING W REFLEX)

## 2016-10-28 ENCOUNTER — Ambulatory Visit (INDEPENDENT_AMBULATORY_CARE_PROVIDER_SITE_OTHER): Payer: BLUE CROSS/BLUE SHIELD | Admitting: Certified Nurse Midwife

## 2016-10-28 VITALS — BP 114/63 | HR 72 | Wt 195.2 lb

## 2016-10-28 DIAGNOSIS — Z3481 Encounter for supervision of other normal pregnancy, first trimester: Secondary | ICD-10-CM

## 2016-10-28 LAB — POCT URINALYSIS DIPSTICK
Bilirubin, UA: NEGATIVE
Blood, UA: NEGATIVE
GLUCOSE UA: NEGATIVE
Ketones, UA: NEGATIVE
LEUKOCYTES UA: NEGATIVE
Nitrite, UA: NEGATIVE
PROTEIN UA: NEGATIVE
Spec Grav, UA: 1.015 (ref 1.010–1.025)
UROBILINOGEN UA: 0.2 U/dL
pH, UA: 7 (ref 5.0–8.0)

## 2016-10-28 NOTE — Progress Notes (Signed)
NOB physical- pt is doing well, having slight nausea in the am

## 2016-10-28 NOTE — Patient Instructions (Signed)
Morning Sickness Morning sickness is when you feel sick to your stomach (nauseous) during pregnancy. This nauseous feeling may or may not come with vomiting. It often occurs in the morning but can be a problem any time of day. Morning sickness is most common during the first trimester, but it may continue throughout pregnancy. While morning sickness is unpleasant, it is usually harmless unless you develop severe and continual vomiting (hyperemesis gravidarum). This condition requires more intense treatment. What are the causes? The cause of morning sickness is not completely known but seems to be related to normal hormonal changes that occur in pregnancy. What increases the risk? You are at greater risk if you:  Experienced nausea or vomiting before your pregnancy.  Had morning sickness during a previous pregnancy.  Are pregnant with more than one baby, such as twins.  How is this treated? Do not use any medicines (prescription, over-the-counter, or herbal) for morning sickness without first talking to your health care provider. Your health care provider may prescribe or recommend:  Vitamin B6 supplements.  Anti-nausea medicines.  The herbal medicine ginger.  Follow these instructions at home:  Only take over-the-counter or prescription medicines as directed by your health care provider.  Taking multivitamins before getting pregnant can prevent or decrease the severity of morning sickness in most women.  Eat a piece of dry toast or unsalted crackers before getting out of bed in the morning.  Eat five or six small meals a day.  Eat dry and bland foods (rice, baked potato). Foods high in carbohydrates are often helpful.  Do not drink liquids with your meals. Drink liquids between meals.  Avoid greasy, fatty, and spicy foods.  Get someone to cook for you if the smell of any food causes nausea and vomiting.  If you feel nauseous after taking prenatal vitamins, take the vitamins at  night or with a snack.  Snack on protein foods (nuts, yogurt, cheese) between meals if you are hungry.  Eat unsweetened gelatins for desserts.  Wearing an acupressure wristband (worn for sea sickness) may be helpful.  Acupuncture may be helpful.  Do not smoke.  Get a humidifier to keep the air in your house free of odors.  Get plenty of fresh air. Contact a health care provider if:  Your home remedies are not working, and you need medicine.  You feel dizzy or lightheaded.  You are losing weight. Get help right away if:  You have persistent and uncontrolled nausea and vomiting.  You pass out (faint). This information is not intended to replace advice given to you by your health care provider. Make sure you discuss any questions you have with your health care provider. Document Released: 04/17/2006 Document Revised: 08/02/2015 Document Reviewed: 08/11/2012 Elsevier Interactive Patient Education  2017 [employer]. Common Medications Safe in Pregnancy  Acne:      Constipation:  Benzoyl Peroxide     Colace  Clindamycin      Dulcolax Suppository  Topica Erythromycin     Fibercon  Salicylic Acid      Metamucil         Miralax AVOID:        Senakot   Accutane    Cough:  Retin-A       Cough Drops  Tetracycline      Phenergan w/ Codeine if Rx  Minocycline      Robitussin (Plain & DM)  Antibiotics:     Crabs/Lice:  Ceclor       RID  Cephalosporins    AVOID:  E-Mycins      Kwell  Keflex  Macrobid/Macrodantin   Diarrhea:  Penicillin      Kao-Pectate  Zithromax      Imodium AD         PUSH FLUIDS AVOID:       Cipro     Fever:  Tetracycline      Tylenol (Regular or Extra  Minocycline       Strength)  Levaquin      Extra Strength-Do not          Exceed 8 tabs/24 hrs Caffeine:        <256m/day (equiv. To 1 cup of coffee or  approx. 3 12 oz  sodas)         Gas: Cold/Hayfever:       Gas-X  Benadryl      Mylicon  Claritin       Phazyme  **Claritin-D        Chlor-Trimeton    Headaches:  Dimetapp      ASA-Free Excedrin  Drixoral-Non-Drowsy     Cold Compress  Mucinex (Guaifenasin)     Tylenol (Regular or Extra  Sudafed/Sudafed-12 Hour     Strength)  **Sudafed PE Pseudoephedrine   Tylenol Cold & Sinus     Vicks Vapor Rub  Zyrtec  **AVOID if Problems With Blood Pressure         Heartburn: Avoid lying down for at least 1 hour after meals  Aciphex      Maalox     Rash:  Milk of Magnesia     Benadryl    Mylanta       1% Hydrocortisone Cream  Pepcid  Pepcid Complete   Sleep Aids:  Prevacid      Ambien   Prilosec       Benadryl  Rolaids       Chamomile Tea  Tums (Limit 4/day)     Unisom  Zantac       Tylenol PM         Warm milk-add vanilla or  Hemorrhoids:       Sugar for taste  Anusol/Anusol H.C.  (RX: Analapram 2.5%)  Sugar Substitutes:  Hydrocortisone OTC     Ok in moderation  Preparation H      Tucks        Vaseline lotion applied to tissue with wiping    Herpes:     Throat:  Acyclovir      Oragel  Famvir  Valtrex     Vaccines:         Flu Shot Leg Cramps:       *Gardasil  Benadryl      Hepatitis A         Hepatitis B Nasal Spray:       Pneumovax  Saline Nasal Spray     Polio Booster         Tetanus Nausea:       Tuberculosis test or PPD  Vitamin B6 25 mg TID   AVOID:    Dramamine      *Gardasil  Emetrol       Live Poliovirus  Ginger Root 250 mg QID    MMR (measles, mumps &  High Complex Carbs @ Bedtime    rebella)  Sea Bands-Accupressure    Varicella (Chickenpox)  Unisom 1/2 tab TID     *No known complications           If received before  Pain:         Known pregnancy;   Darvocet       Resume series after  Lortab        Delivery  Percocet    Yeast:   Tramadol      Femstat  Tylenol 3      Gyne-lotrimin  Ultram       Monistat  Vicodin           MISC:         All  Sunscreens           Hair Coloring/highlights          Insect Repellant's          (Including DEET)         Mystic Tans Second Trimester of Pregnancy The second trimester is from week 14 through week 27 (months 4 through 6). The second trimester is often a time when you feel your best. Your body has adjusted to being pregnant, and you begin to feel better physically. Usually, morning sickness has lessened or quit completely, you may have more energy, and you may have an increase in appetite. The second trimester is also a time when the fetus is growing rapidly. At the end of the sixth month, the fetus is about 9 inches long and weighs about 1 pounds. You will likely begin to feel the baby move (quickening) between 16 and 20 weeks of pregnancy. Body changes during your second trimester Your body continues to go through many changes during your second trimester. The changes vary from woman to woman.  Your weight will continue to increase. You will notice your lower abdomen bulging out.  You may begin to get stretch marks on your hips, abdomen, and breasts.  You may develop headaches that can be relieved by medicines. The medicines should be approved by your health care provider.  You may urinate more often because the fetus is pressing on your bladder.  You may develop or continue to have heartburn as a result of your pregnancy.  You may develop constipation because certain hormones are causing the muscles that push waste through your intestines to slow down.  You may develop hemorrhoids or swollen, bulging veins (varicose veins).  You may have back pain. This is caused by: ? Weight gain. ? Pregnancy hormones that are relaxing the joints in your pelvis. ? A shift in weight and the muscles that support your balance.  Your breasts will continue to grow and they will continue to become tender.  Your gums may bleed and may be sensitive to brushing and flossing.  Dark spots or blotches  (chloasma, mask of pregnancy) may develop on your face. This will likely fade after the baby is born.  A dark line from your belly button to the pubic area (linea nigra) may appear. This will likely fade after the baby is born.  You may have changes in your hair. These can include thickening of your hair, rapid growth, and changes in texture. Some women also have hair loss during or after pregnancy, or hair that feels dry or thin. Your hair will most likely return to normal after your baby is born.  What to expect at prenatal visits During a routine prenatal visit:  You will be weighed to make sure you and the fetus are growing normally.  Your blood pressure will be taken.  Your abdomen will be measured to track your baby's growth.  The fetal heartbeat will be listened to.  Any test results from the previous visit will be discussed.  Your health care provider may ask you:  How you are feeling.  If you are feeling the baby move.  If you have had any abnormal symptoms, such as leaking fluid, bleeding, severe headaches, or abdominal cramping.  If you are using any tobacco products, including cigarettes, chewing tobacco, and electronic cigarettes.  If you have any questions.  Other tests that may be performed during your second trimester include:  Blood tests that check for: ? Low iron levels (anemia). ? High blood sugar that affects pregnant women (gestational diabetes) between 58 and 28 weeks. ? Rh antibodies. This is to check for a protein on red blood cells (Rh factor).  Urine tests to check for infections, diabetes, or protein in the urine.  An ultrasound to confirm the proper growth and development of the baby.  An amniocentesis to check for possible genetic problems.  Fetal screens for spina bifida and Down syndrome.  HIV (human immunodeficiency virus) testing. Routine prenatal testing includes screening for HIV, unless you choose not to have this test.  Follow  these instructions at home: Medicines  Follow your health care provider's instructions regarding medicine use. Specific medicines may be either safe or unsafe to take during pregnancy.  Take a prenatal vitamin that contains at least 600 micrograms (mcg) of folic acid.  If you develop constipation, try taking a stool softener if your health care provider approves. Eating and drinking  Eat a balanced diet that includes fresh fruits and vegetables, whole grains, good sources of protein such as meat, eggs, or tofu, and low-fat dairy. Your health care provider will help you determine the amount of weight gain that is right for you.  Avoid raw meat and uncooked cheese. These carry germs that can cause birth defects in the baby.  If you have low calcium intake from food, talk to your health care provider about whether you should take a daily calcium supplement.  Limit foods that are high in fat and processed sugars, such as fried and sweet foods.  To prevent constipation: ? Drink enough fluid to keep your urine clear or pale yellow. ? Eat foods that are high in fiber, such as fresh fruits and vegetables, whole grains, and beans. Activity  Exercise only as directed by your health care provider. Most women can continue their usual exercise routine during pregnancy. Try to exercise for 30 minutes at least 5 days a week. Stop exercising if you experience uterine contractions.  Avoid heavy lifting, wear low heel shoes, and practice good posture.  A sexual relationship may be continued unless your health care provider directs you otherwise. Relieving pain and discomfort  Wear a good support bra to prevent discomfort from breast tenderness.  Take warm sitz baths to soothe any pain or discomfort caused by hemorrhoids. Use hemorrhoid cream if your health care provider approves.  Rest with your legs elevated if you have leg cramps or low back pain.  If you develop varicose veins, wear support  hose. Elevate your feet for 15 minutes, 3-4 times a day. Limit salt in your diet. Prenatal Care  Write down your questions. Take them to your prenatal visits.  Keep all your prenatal visits as told by your health care provider. This is important. Safety  Wear your seat belt at all times when driving.  Make a list of emergency phone numbers, including numbers for family, friends, the hospital, and police and fire departments. General instructions  Ask  your health care provider for a referral to a local prenatal education class. Begin classes no later than the beginning of month 6 of your pregnancy.  Ask for help if you have counseling or nutritional needs during pregnancy. Your health care provider can offer advice or refer you to specialists for help with various needs.  Do not use hot tubs, steam rooms, or saunas.  Do not douche or use tampons or scented sanitary pads.  Do not cross your legs for long periods of time.  Avoid cat litter boxes and soil used by cats. These carry germs that can cause birth defects in the baby and possibly loss of the fetus by miscarriage or stillbirth.  Avoid all smoking, herbs, alcohol, and unprescribed drugs. Chemicals in these products can affect the formation and growth of the baby.  Do not use any products that contain nicotine or tobacco, such as cigarettes and e-cigarettes. If you need help quitting, ask your health care provider.  Visit your dentist if you have not gone yet during your pregnancy. Use a soft toothbrush to brush your teeth and be gentle when you floss. Contact a health care provider if:  You have dizziness.  You have mild pelvic cramps, pelvic pressure, or nagging pain in the abdominal area.  You have persistent nausea, vomiting, or diarrhea.  You have a bad smelling vaginal discharge.  You have pain when you urinate. Get help right away if:  You have a fever.  You are leaking fluid from your vagina.  You have  spotting or bleeding from your vagina.  You have severe abdominal cramping or pain.  You have rapid weight gain or weight loss.  You have shortness of breath with chest pain.  You notice sudden or extreme swelling of your face, hands, ankles, feet, or legs.  You have not felt your baby move in over an hour.  You have severe headaches that do not go away when you take medicine.  You have vision changes. Summary  The second trimester is from week 14 through week 27 (months 4 through 6). It is also a time when the fetus is growing rapidly.  Your body goes through many changes during pregnancy. The changes vary from woman to woman.  Avoid all smoking, herbs, alcohol, and unprescribed drugs. These chemicals affect the formation and growth your baby.  Do not use any tobacco products, such as cigarettes, chewing tobacco, and e-cigarettes. If you need help quitting, ask your health care provider.  Contact your health care provider if you have any questions. Keep all prenatal visits as told by your health care provider. This is important. This information is not intended to replace advice given to you by your health care provider. Make sure you discuss any questions you have with your health care provider. Document Released: 02/18/2001 Document Revised: 08/02/2015 Document Reviewed: 04/27/2012 Elsevier Interactive Patient Education  2017 [employer].

## 2016-10-29 LAB — CBC WITH DIFFERENTIAL/PLATELET
BASOS: 0 %
Basophils Absolute: 0 10*3/uL (ref 0.0–0.2)
EOS (ABSOLUTE): 0.5 10*3/uL — AB (ref 0.0–0.4)
EOS: 4 %
HEMATOCRIT: 37.7 % (ref 34.0–46.6)
Hemoglobin: 12.5 g/dL (ref 11.1–15.9)
Immature Grans (Abs): 0 10*3/uL (ref 0.0–0.1)
Immature Granulocytes: 0 %
LYMPHS ABS: 2.6 10*3/uL (ref 0.7–3.1)
Lymphs: 23 %
MCH: 27.6 pg (ref 26.6–33.0)
MCHC: 33.2 g/dL (ref 31.5–35.7)
MCV: 83 fL (ref 79–97)
MONOS ABS: 1.1 10*3/uL — AB (ref 0.1–0.9)
Monocytes: 10 %
NEUTROS PCT: 63 %
Neutrophils Absolute: 7.1 10*3/uL — ABNORMAL HIGH (ref 1.4–7.0)
PLATELETS: 298 10*3/uL (ref 150–379)
RBC: 4.53 x10E6/uL (ref 3.77–5.28)
RDW: 14.4 % (ref 12.3–15.4)
WBC: 11.3 10*3/uL — AB (ref 3.4–10.8)

## 2016-10-29 LAB — MONITOR DRUG PROFILE 14(MW)
Amphetamine Scrn, Ur: NEGATIVE ng/mL
BARBITURATE SCREEN URINE: NEGATIVE ng/mL
BENZODIAZEPINE SCREEN, URINE: NEGATIVE ng/mL
BUPRENORPHINE, URINE: NEGATIVE ng/mL
CANNABINOIDS UR QL SCN: NEGATIVE ng/mL
COCAINE(METAB.)SCREEN, URINE: NEGATIVE ng/mL
CREATININE(CRT), U: 67.8 mg/dL (ref 20.0–300.0)
FENTANYL, URINE: NEGATIVE pg/mL
MEPERIDINE SCREEN, URINE: NEGATIVE ng/mL
Methadone Screen, Urine: NEGATIVE ng/mL
OPIATE SCREEN URINE: NEGATIVE ng/mL
OXYCODONE+OXYMORPHONE UR QL SCN: NEGATIVE ng/mL
PHENCYCLIDINE QUANTITATIVE URINE: NEGATIVE ng/mL
PROPOXYPHENE SCREEN URINE: NEGATIVE ng/mL
Ph of Urine: 7.1 (ref 4.5–8.9)
SPECIFIC GRAVITY: 1.021
TRAMADOL SCREEN, URINE: NEGATIVE ng/mL

## 2016-10-29 LAB — HIV ANTIBODY (ROUTINE TESTING W REFLEX): HIV Screen 4th Generation wRfx: NONREACTIVE

## 2016-10-29 LAB — URINALYSIS, ROUTINE W REFLEX MICROSCOPIC
Bilirubin, UA: NEGATIVE
Glucose, UA: NEGATIVE
KETONES UA: NEGATIVE
Leukocytes, UA: NEGATIVE
NITRITE UA: NEGATIVE
PH UA: 7.5 (ref 5.0–7.5)
Protein, UA: NEGATIVE
RBC, UA: NEGATIVE
SPEC GRAV UA: 1.019 (ref 1.005–1.030)
Urobilinogen, Ur: 0.2 mg/dL (ref 0.2–1.0)

## 2016-10-29 LAB — GC/CHLAMYDIA PROBE AMP
Chlamydia trachomatis, NAA: NEGATIVE
Neisseria gonorrhoeae by PCR: NEGATIVE

## 2016-10-29 LAB — ABO AND RH: RH TYPE: POSITIVE

## 2016-10-29 LAB — HEPATITIS B SURFACE ANTIGEN: Hepatitis B Surface Ag: NEGATIVE

## 2016-10-29 LAB — RPR: RPR Ser Ql: NONREACTIVE

## 2016-10-29 LAB — VARICELLA ZOSTER ANTIBODY, IGG: Varicella zoster IgG: 446 index (ref >165)

## 2016-10-29 LAB — ANTIBODY SCREEN: Antibody Screen: NEGATIVE

## 2016-10-29 LAB — RUBELLA SCREEN: Rubella Antibodies, IGG: 2.76 index (ref >0.99)

## 2016-10-31 LAB — URINE CULTURE

## 2016-11-02 NOTE — Progress Notes (Signed)
NEW OB HISTORY AND PHYSICAL  SUBJECTIVE:       Carly Henry is a 21 y.o. G1P0 female, Patient's last menstrual period was 08/04/2016 (exact date)., Estimated Date of Delivery: 05/20/17, [redacted]w[redacted]d, presents today for establishment of Prenatal Care.  Reports nausea without vomiting, headaches, and fatigue. Using home treatment measures.   Denies difficulty breathing or respiratory distress, chest pain, abdominal pain, vaginal bleeding, dysuria, and leg pain or swelling.    Gynecologic History  Patient's last menstrual period was 08/04/2016 (exact date).  Contraception: none  Last Pap: due.   Obstetric History  OB History  Gravida Para Term Preterm AB Living  1            SAB TAB Ectopic Multiple Live Births               # Outcome Date GA Lbr Len/2nd Weight Sex Delivery Anes PTL Lv  1 Current               Past Medical History:  Diagnosis Date  . Migraine     Past Surgical History:  Procedure Laterality Date  . no surgerical history      Current Outpatient Prescriptions on File Prior to Visit  Medication Sig Dispense Refill  . Prenatal Vit-Fe Fumarate-FA (PRENATAL VITAMINS) 28-0.8 MG TABS Take by mouth.     No current facility-administered medications on file prior to visit.     No Known Allergies  Social History   Social History  . Marital status: Married    Spouse name: N/A  . Number of children: 0  . Years of education: 12+   Occupational History  . College student    Social History Main Topics  . Smoking status: Never Smoker  . Smokeless tobacco: Never Used  . Alcohol use Yes     Comment: Rare  . Drug use: No  . Sexual activity: Yes    Birth control/ protection: None   Other Topics Concern  . Not on file   Social History Narrative  . No narrative on file    Family History  Problem Relation Age of Onset  . Gestational diabetes Mother   . Migraines Mother   . High Cholesterol Mother   . High Cholesterol Father   . Diabetes  Maternal Grandmother   . Diabetes Maternal Grandfather     The following portions of the patient's history were reviewed and updated as appropriate: allergies, current medications, past OB history, past medical history, past surgical history, past family history, past social history, and problem list.   OBJECTIVE:  Initial Physical Exam (New OB)  GENERAL APPEARANCE: alert, well appearing, in no apparent distress  HEAD: normocephalic, atraumatic  MOUTH: mucous membranes moist, pharynx normal without lesions and dental hygiene good  THYROID: no thyromegaly or masses present  BREASTS: no masses noted, no significant tenderness, no palpable axillary nodes, no skin changes  LUNGS: clear to auscultation, no wheezes, rales or rhonchi, symmetric air entry  HEART: regular rate and rhythm, no murmurs  ABDOMEN: soft, nontender, nondistended, no abnormal masses, no epigastric pain, obese, fundus not palpable and FHT present  EXTREMITIES: no redness or tenderness in the calves or thighs, no edema  SKIN: normal coloration and turgor, no rashes  LYMPH NODES: no adenopathy palpable  NEUROLOGIC: alert, oriented, normal speech, no focal findings or movement disorder noted  PELVIC EXAM: Deferred  ASSESSMENT:  Normal pregnancy BMI > 30 Declines genetic testing  PLAN: Prenatal care New OB labs today Declines Pap  today, would like postpartum Reviewed red flag symptoms and when to call RTC x 4 weeks for ROB or sooner if needed See orders   Gunnar Bulla, CNM [facility], [facility]

## 2016-11-24 ENCOUNTER — Encounter: Payer: Self-pay | Admitting: Certified Nurse Midwife

## 2016-11-24 ENCOUNTER — Ambulatory Visit (INDEPENDENT_AMBULATORY_CARE_PROVIDER_SITE_OTHER): Payer: BLUE CROSS/BLUE SHIELD | Admitting: Certified Nurse Midwife

## 2016-11-24 ENCOUNTER — Encounter: Payer: BLUE CROSS/BLUE SHIELD | Admitting: Certified Nurse Midwife

## 2016-11-24 VITALS — BP 107/58 | HR 78 | Wt 193.6 lb

## 2016-11-24 DIAGNOSIS — Z3482 Encounter for supervision of other normal pregnancy, second trimester: Secondary | ICD-10-CM

## 2016-11-24 LAB — POCT URINALYSIS DIPSTICK
BILIRUBIN UA: NEGATIVE
GLUCOSE UA: NEGATIVE
LEUKOCYTES UA: NEGATIVE
Nitrite, UA: NEGATIVE
PROTEIN UA: NEGATIVE
Urobilinogen, UA: 0.2 E.U./dL
pH, UA: 6 (ref 5.0–8.0)

## 2016-11-24 NOTE — Progress Notes (Signed)
Pt is here for a routine OB visit. C/o nausea.

## 2016-11-24 NOTE — Patient Instructions (Signed)
                    Acidulated Phosphate Fluoride dental gel What is this medicine? ACIDULATED PHOSPHATE FLUORIDE (uh SIJ uh ley tid FOS fate FLOOR ide) provides sodium fluoride. This is a mineral that strengthens tooth enamel and prevents tooth decay. Do not use this medicine unless your dentist recommends it for you. Ask your dentist for advice, especially if you move to a new area where the amount of fluoride in the water may be different. This mineral may be used for other purposes; ask your health care provider or pharmacist if you have questions. This medicine may be used for other purposes; ask your health care provider or pharmacist if you have questions. COMMON BRAND NAME(S): 60 Second, APF, Carles 60 Second APF, Dental Resources APF, Fluorident, Minute-Gel, Nupro APF Gel, Perfect Choice One Minute APF, Quik-Set, Topex 00:60 Second APF What should I tell my health care provider before I take this medicine? They need to know if you have any of these conditions: -porcelain or ceramic dentures -an unusual or allergic reaction to fluoride, other medicines, foods, dyes, or preservatives -pregnant or trying to get pregnant -breast-feeding How should I use this medicine? This medicine is applied to the teeth. Follow the directions on the prescription label. Brush with your normal toothpaste and rinse thoroughly. Apply a ribbon of this medicine to teeth using a toothbrush or mouth tray for at least one minute. Spit out the gel and do not eat, drink or rinse for 30 minutes for best results. Children 6 to 16 years of age should rinse their mouth thoroughly with water after use. DO NOT SWALLOW the dental gel. Do not use more often than directed. Talk to your pediatrician regarding the use of this medicine in children. While this medicine may be prescribed for children as young as 6 years for selected conditions, precautions do apply. Overdosage: If you think you have taken too much  of this medicine contact a poison control center or emergency room at once. NOTE: This medicine is only for you. Do not share this medicine with others. What if I miss a dose? If you miss a dose, use it as soon as you can. If it is almost time for your next dose, use only that dose. Do not use double or extra doses. What may interact with this medicine? Interactions are not expected. Do not use any other dental products without asking your dentist or health care professional. This list may not describe all possible interactions. Give your health care provider a list of all the medicines, herbs, non-prescription drugs, or dietary supplements you use. Also tell them if you smoke, drink alcohol, or use illegal drugs. Some items may interact with your medicine. What should I watch for while using this medicine? This product may reduce your risk of dental cavities. Continue to follow proper dental hygiene, such as regular brushing and flossing. See your dentist for regular checkups. If this gel is accidentally swallowed, contact a Poison Control Center immediately. Also, prolonged daily ingestion can cause abnormal tooth enamel. Report any signs of abnormal tooth color or staining or spotting of the teeth right away. What side effects may I notice from receiving this medicine? Side effects that you should report to your doctor or health care professional as soon as possible: -allergic reactions like skin rash, itching or hives, swelling of the face, lips, or tongue -discolored teeth Side effects that usually do not require   medical attention (report to your doctor or health care professional if they continue or are bothersome): -mild burning or gum irritation This list may not describe all possible side effects. Call your doctor for medical advice about side effects. You may report side effects to FDA at 1-800-FDA-1088. Where should I keep my medicine? Keep out of the reach of children. Store at room  temperature between 20 and 25 degrees C (68 and 77 degrees F). Throw away any unused medicine after the expiration date. NOTE: This sheet is a summary. It may not cover all possible information. If you have questions about this medicine, talk to your doctor, pharmacist, or health care provider.  2018 Elsevier/Gold Standard (2007-05-20 11:53:43)  

## 2016-11-24 NOTE — Progress Notes (Signed)
ROB, doing well. She has some nausea. Sample of Bonjesta given with instructions on use. Reviewed prenatal labs and discussed genetic testing options. Information on panorama given to pt with card to call about insurance coverage. Urine shows blood and ketones. Reviewed with pt. Urine sent for culture. She will follow up in 4 wks.   Doreene Burke, CNM

## 2016-12-22 ENCOUNTER — Telehealth: Payer: Self-pay | Admitting: Certified Nurse Midwife

## 2016-12-22 NOTE — Telephone Encounter (Signed)
Patient wants to go ahead and schedule her anatomy scan - her mom is leaving the state the beginning of Nov  Please advise

## 2016-12-26 ENCOUNTER — Ambulatory Visit (INDEPENDENT_AMBULATORY_CARE_PROVIDER_SITE_OTHER): Payer: BLUE CROSS/BLUE SHIELD | Admitting: Obstetrics and Gynecology

## 2016-12-26 VITALS — BP 110/78 | HR 88 | Wt 195.3 lb

## 2016-12-26 DIAGNOSIS — Z23 Encounter for immunization: Secondary | ICD-10-CM | POA: Diagnosis not present

## 2016-12-26 DIAGNOSIS — E669 Obesity, unspecified: Secondary | ICD-10-CM | POA: Insufficient documentation

## 2016-12-26 DIAGNOSIS — Z3492 Encounter for supervision of normal pregnancy, unspecified, second trimester: Secondary | ICD-10-CM

## 2016-12-26 LAB — POCT URINALYSIS DIPSTICK
Bilirubin, UA: NEGATIVE
Glucose, UA: NEGATIVE
KETONES UA: NEGATIVE
Leukocytes, UA: NEGATIVE
Nitrite, UA: NEGATIVE
PH UA: 6 (ref 5.0–8.0)
RBC UA: NEGATIVE
SPEC GRAV UA: 1.01 (ref 1.010–1.025)
Urobilinogen, UA: 0.2 E.U./dL

## 2016-12-26 NOTE — Progress Notes (Signed)
ROB-doing well, anatomy scan next week, flu vaccine given

## 2016-12-26 NOTE — Progress Notes (Signed)
ROB- pt is doing well, needs her anatomy scan done

## 2016-12-26 NOTE — Addendum Note (Signed)
Addended by: Rosine BeatLONTZ, Mandrell Vangilder L on: 12/26/2016 04:56 PM   Modules accepted: Orders

## 2016-12-26 NOTE — Telephone Encounter (Signed)
Will discuss at ov 

## 2016-12-31 ENCOUNTER — Encounter: Payer: Self-pay | Admitting: Obstetrics and Gynecology

## 2016-12-31 ENCOUNTER — Ambulatory Visit (INDEPENDENT_AMBULATORY_CARE_PROVIDER_SITE_OTHER): Payer: BLUE CROSS/BLUE SHIELD

## 2016-12-31 DIAGNOSIS — Z3492 Encounter for supervision of normal pregnancy, unspecified, second trimester: Secondary | ICD-10-CM | POA: Diagnosis not present

## 2017-01-08 ENCOUNTER — Other Ambulatory Visit: Payer: Self-pay | Admitting: Obstetrics and Gynecology

## 2017-01-08 DIAGNOSIS — IMO0002 Reserved for concepts with insufficient information to code with codable children: Secondary | ICD-10-CM

## 2017-01-08 DIAGNOSIS — Z0489 Encounter for examination and observation for other specified reasons: Secondary | ICD-10-CM

## 2017-01-13 ENCOUNTER — Ambulatory Visit (INDEPENDENT_AMBULATORY_CARE_PROVIDER_SITE_OTHER): Payer: BLUE CROSS/BLUE SHIELD

## 2017-01-13 DIAGNOSIS — Z0489 Encounter for examination and observation for other specified reasons: Secondary | ICD-10-CM | POA: Diagnosis not present

## 2017-01-13 DIAGNOSIS — IMO0002 Reserved for concepts with insufficient information to code with codable children: Secondary | ICD-10-CM

## 2017-01-23 ENCOUNTER — Ambulatory Visit (INDEPENDENT_AMBULATORY_CARE_PROVIDER_SITE_OTHER): Payer: BLUE CROSS/BLUE SHIELD | Admitting: Certified Nurse Midwife

## 2017-01-23 ENCOUNTER — Telehealth: Payer: Self-pay

## 2017-01-23 VITALS — BP 118/74 | HR 90 | Wt 194.0 lb

## 2017-01-23 DIAGNOSIS — O219 Vomiting of pregnancy, unspecified: Secondary | ICD-10-CM

## 2017-01-23 DIAGNOSIS — Z130 Encounter for screening for diseases of the blood and blood-forming organs and certain disorders involving the immune mechanism: Secondary | ICD-10-CM

## 2017-01-23 DIAGNOSIS — Z131 Encounter for screening for diabetes mellitus: Secondary | ICD-10-CM

## 2017-01-23 DIAGNOSIS — Z3492 Encounter for supervision of normal pregnancy, unspecified, second trimester: Secondary | ICD-10-CM

## 2017-01-23 LAB — POCT URINALYSIS DIPSTICK
Bilirubin, UA: NEGATIVE
GLUCOSE UA: NEGATIVE
Ketones, UA: NEGATIVE
NITRITE UA: NEGATIVE
PH UA: 6.5 (ref 5.0–8.0)
Spec Grav, UA: 1.015 (ref 1.010–1.025)
UROBILINOGEN UA: 0.2 U/dL

## 2017-01-23 MED ORDER — ONDANSETRON 4 MG PO TBDP: 4.0000 mg | ORAL_TABLET | Freq: Four times a day (QID) | ORAL | 0 refills | Status: DC | PRN

## 2017-01-23 NOTE — Progress Notes (Signed)
[name] nausea and vomiting for the last 24 hour. Denies sick exposures or diarrhea. Offered in office Zofran injection, pt declined. Rx: Zofran, see orders. Results of follow up anatomy scan reviewed with pt, verbalized understanding ultrasound comlete and normal. Discussed home treatment measures. Anticipatory guidance regarding 28 week screening, vaccinations in pregnancy and course of prenatal care. Reviewed red red flag symptoms and when to call. RTC x 4-5weeks for Glucola, CBC, TDaP and ROB with Pattricia BossAnnie.   ULTRASOUND REPORT  Location: [facility] Date of Service:  01/13/17  Indications: F/U Anatomy Findings:  Singleton intrauterine pregnancy is visualized with FHR at 145 BPM.  Fetal presentation is footling breech.  Placenta: Posterior and grade 1. AFI: WNL subjectively.  Anatomic survey is now complete after obtaining views of the fetal profile, face, nose/lips, thalamus/CSP, posterior fossa, and open hand.   Routine evaluation of fetal stomach, kidneys, and bladder appears WNL.  Impression: 1. Anatomy scan is now complete and appears WNL.  Recommendations: 1.Clinical correlation with the patient's History and Physical Exam.

## 2017-01-23 NOTE — Progress Notes (Signed)
ROB- pt has been vomiting since yesterday, otherwise she is doing ok, work note provided for pt was sick 01/22/17

## 2017-01-23 NOTE — Telephone Encounter (Signed)
Call transferred from front desk- OB 23 2/7 states since yesterday am she has been vomiting. A total of 1 0x yesterday. Did leave work early d/t sickness. This am she had slight nausea. Tried to eat and vomited again. NO fevers. Slight abd pain. NO diarrhea. Pos for chills. Pos for h/a. NO new foods. Has been up since 6 am. Urinated x2. Pt may have stomach bug. Encouraged po hydration with ginger ale, ice chips, Gatorade, Pedialyte or sprite. Brat diet as tolerated. Advised pt to r/s appt for Monday. Pt states she needs a note for work. Pt to be seen today at 1:45(instead of 4:15). Advised AC to give mask at check in.

## 2017-01-23 NOTE — Patient Instructions (Signed)
Exercise During Pregnancy For people of all ages, exercise is an important part of being healthy. Exercise improves heart and lung function and helps to maintain strength, flexibility, and a healthy body weight. Exercise also boosts energy levels and elevates mood. For most women, maintaining an exercise routine throughout pregnancy is recommended. It is only on rare occasions and with certain medical conditions or pregnancy complications that women may be asked to limit or avoid exercise during pregnancy. What are some other benefits to exercising during pregnancy? Along with maintaining strength and flexibility, exercising throughout pregnancy can help to:  Keep strength in muscles that are very important during labor and childbirth.  Decrease low back pain during pregnancy.  Decrease the risk of developing gestational diabetes mellitus (GDM).  Improve blood sugar (glucose) control for women who have GDM.  Decrease the risk of developing preeclampsia. This is a serious condition that causes high blood pressure along with other symptoms, such as swelling and headaches.  Decrease the risk of cesarean delivery.  Speed up the recovery after giving birth.  How often should I exercise? Unless your health care provider gives you different instructions, you should try to exercise on most days or all days of the week. In general, try to exercise with moderate intensity for about 150 minutes per week. This can be spread out across several days, such as exercising for 30 minutes per day on 5 days of each week. You can tell that you are exercising at a moderate intensity if you have a higher heart rate and faster breathing, but you are still able to hold a conversation. What types of moderate-intensity exercise are recommended during pregnancy? There are many types of exercise that are safe for you to do during pregnancy. Unless your health care provider gives you different instructions, do a variety of  exercises that safely increase your heart and breathing (cardiopulmonary) rates and help you to build and maintain muscle strength (strength training). You should always be able to talk in full sentences while exercising during pregnancy. Some examples of exercising that is safe to do during pregnancy include:  Brisk walking or hiking.  Swimming.  Water aerobics.  Riding a stationary bike.  Strength training.  Modified yoga or Pilates. Tell your instructor that you are pregnant. Avoid overstretching and avoid lying on your back for long periods of time.  Running or jogging. Only choose this type of exercise if: ? You ran or jogged regularly before your pregnancy. ? You can run or jog and still talk in complete sentences.  What types of exercise should I not do during pregnancy? Depending on your level of fitness and whether you exercised regularly before your pregnancy, you may be advised to limit vigorous-intensity exercise during your pregnancy. You can tell that you are exercising at a vigorous intensity if you are breathing much harder and faster and cannot hold a conversation while exercising. Some examples of exercising that you should avoid during pregnancy include:  Contact sports.  Activities that place you at risk for falling on or being hit in the belly, such as downhill skiing, water skiing, surfing, rock climbing, cycling, gymnastics, and horseback riding.  Scuba diving.  Sky diving.  Yoga or Pilates in a room that is heated to extreme temperatures ("hot yoga" or "hot Pilates").  Jogging or running, unless you ran or jogged regularly before your pregnancy. While jogging or running, you should always be able to talk in full sentences. Do not run or jog so vigorously  that you are unable to have a conversation.  If you are not used to exercising at elevation (more than 6,000 feet above sea level), do not do so during your pregnancy.  When should I avoid exercising  during pregnancy? Certain medical conditions can make it unsafe to exercise during pregnancy, or they may increase your risk of miscarriage or early labor and birth. Some of these conditions include:  Some types of heart disease.  Some types of lung disease.  Placenta previa. This is when the placenta partially or completely covers the opening of the uterus (cervix).  Frequent bleeding from the vagina during your pregnancy.  Incompetent cervix. This is when your cervix does not remain as tightly closed during pregnancy as it should.  Premature labor.  Ruptured membranes. This is when the protective sac (amniotic sac) opens up and amniotic fluid leaks from your vagina.  Severely low blood count (anemia).  Preeclampsia or pregnancy-caused high blood pressure.  Carrying more than one baby (multiple gestation) and having an additional risk of early labor.  Poorly controlled diabetes.  Being severely underweight or severely overweight.  Intrauterine growth restriction. This is when your baby's growth and development during pregnancy are slower than expected.  Other medical conditions. Ask your health care provider if any apply to you.  What else should I know about exercising during pregnancy? You should take these precautions while exercising during pregnancy:  Avoid overheating. ? Wear loose-fitting, breathable clothes. ? Do not exercise in very high temperatures.  Avoid dehydration. Drink enough water before, during, and after exercise to keep your urine clear or pale yellow.  Avoid overstretching. Because of hormone changes during pregnancy, it is easy to overstretch muscles, tendons, and ligaments during pregnancy.  Start slowly and ask your health care provider to recommend types of exercise that are safe for you, if exercising regularly is new for you.  Pregnancy is not a time for exercising to lose weight. When should I seek medical care? You should stop exercising  and call your health care provider if you have any unusual symptoms, such as:  Mild uterine contractions or abdominal cramping.  Dizziness that does not improve with rest.  When should I seek immediate medical care? You should stop exercising and call your local emergency services (911 in the [place] if you have any unusual symptoms, such as:  Sudden, severe pain in your low back or your belly.  Uterine contractions or abdominal cramping that do not improve with rest.  Chest pain.  Bleeding or fluid leaking from your vagina.  Shortness of breath.  This information is not intended to replace advice given to you by your health care provider. Make sure you discuss any questions you have with your health care provider. Document Released: 02/24/2005 Document Revised: 07/25/2015 Document Reviewed: 05/04/2014 Elsevier Interactive Patient Education  2018 [place]. Back Pain in Pregnancy Back pain during pregnancy is common. Back pain may be caused by several factors that are related to changes during your pregnancy. Follow these instructions at home: Managing pain, stiffness, and swelling  If directed, apply ice for sudden (acute) back pain. ? Put ice in a plastic bag. ? Place a towel between your skin and the bag. ? Leave the ice on for 20 minutes, 2-3 times per day.  If directed, apply heat to the affected area before you exercise: ? Place a towel between your skin and the heat pack or heating pad. ? Leave the heat on for 20-30 minutes. ? Remove  the heat if your skin turns bright red. This is especially important if you are unable to feel pain, heat, or cold. You may have a greater risk of getting burned. Activity  Exercise as told by your health care provider. Exercising is the best way to prevent or manage back pain.  Listen to your body when lifting. If lifting hurts, ask for help or bend your knees. This uses your leg muscles instead of your back muscles.  Squat down when  picking up something from the floor. Do not bend over.  Only use bed rest as told by your health care provider. Bed rest should only be used for the most severe episodes of back pain. Standing, Sitting, and Lying Down  Do not stand in one place for long periods of time.  Use good posture when sitting. Make sure your head rests over your shoulders and is not hanging forward. Use a pillow on your lower back if necessary.  Try sleeping on your side, preferably the left side, with a pillow or two between your legs. If you are sore after a night's rest, your bed may be too soft. A firm mattress may provide more support for your back during pregnancy. General instructions  Do not wear high heels.  Eat a healthy diet. Try to gain weight within your health care provider's recommendations.  Use a maternity girdle, elastic sling, or back brace as told by your health care provider.  Take over-the-counter and prescription medicines only as told by your health care provider.  Keep all follow-up visits as told by your health care provider. This is important. This includes any visits with any specialists, such as a physical therapist. Contact a health care provider if:  Your back pain interferes with your daily activities.  You have increasing pain in other parts of your body. Get help right away if:  You develop numbness, tingling, weakness, or problems with the use of your arms or legs.  You develop severe back pain that is not controlled with medicine.  You have a sudden change in bowel or bladder control.  You develop shortness of breath, dizziness, or you faint.  You develop nausea, vomiting, or sweating.  You have back pain that is a rhythmic, cramping pain similar to labor pains. Labor pain is usually 1-2 minutes apart, lasts for about 1 minute, and involves a bearing down feeling or pressure in your pelvis.  You have back pain and your water breaks or you have vaginal  bleeding.  You have back pain or numbness that travels down your leg.  Your back pain developed after you fell.  You develop pain on one side of your back.  You see blood in your urine.  You develop skin blisters in the area of your back pain. This information is not intended to replace advice given to you by your health care provider. Make sure you discuss any questions you have with your health care provider. Document Released: 06/04/2005 Document Revised: 08/02/2015 Document Reviewed: 11/08/2014 Elsevier Interactive Patient Education  2018 [employer]. Round Ligament Pain The round ligament is a cord of muscle and tissue that helps to support the uterus. It can become a source of pain during pregnancy if it becomes stretched or twisted as the baby grows. The pain usually begins in the second trimester of pregnancy, and it can come and go until the baby is delivered. It is not a serious problem, and it does not cause harm to the baby. Round  ligament pain is usually a short, sharp, and pinching pain, but it can also be a dull, lingering, and aching pain. The pain is felt in the lower side of the abdomen or in the groin. It usually starts deep in the groin and moves up to the outside of the hip area. Pain can occur with:  A sudden change in position.  Rolling over in bed.  Coughing or sneezing.  Physical activity.  Follow these instructions at home: Watch your condition for any changes. Take these steps to help with your pain:  When the pain starts, relax. Then try: ? Sitting down. ? Flexing your knees up to your abdomen. ? Lying on your side with one pillow under your abdomen and another pillow between your legs. ? Sitting in a warm bath for 15-20 minutes or until the pain goes away.  Take over-the-counter and prescription medicines only as told by your health care provider.  Move slowly when you sit and stand.  Avoid long walks if they cause pain.  Stop or lessen your  physical activities if they cause pain.  Contact a health care provider if:  Your pain does not go away with treatment.  You feel pain in your back that you did not have before.  Your medicine is not helping. Get help right away if:  You develop a fever or chills.  You develop uterine contractions.  You develop vaginal bleeding.  You develop nausea or vomiting.  You develop diarrhea.  You have pain when you urinate. This information is not intended to replace advice given to you by your health care provider. Make sure you discuss any questions you have with your health care provider. Document Released: 12/04/2007 Document Revised: 08/02/2015 Document Reviewed: 05/03/2014 Elsevier Interactive Patient Education  2018 [employer]. Common Medications Safe in Pregnancy  Acne:      Constipation:  Benzoyl Peroxide     Colace  Clindamycin      Dulcolax Suppository  Topica Erythromycin     Fibercon  Salicylic Acid      Metamucil         Miralax AVOID:        Senakot   Accutane    Cough:  Retin-A       Cough Drops  Tetracycline      Phenergan w/ Codeine if Rx  Minocycline      Robitussin (Plain & DM)  Antibiotics:     Crabs/Lice:  Ceclor       RID  Cephalosporins    AVOID:  E-Mycins      Kwell  Keflex  Macrobid/Macrodantin   Diarrhea:  Penicillin      Kao-Pectate  Zithromax      Imodium AD         PUSH FLUIDS AVOID:       Cipro     Fever:  Tetracycline      Tylenol (Regular or Extra  Minocycline       Strength)  Levaquin      Extra Strength-Do not          Exceed 8 tabs/24 hrs Caffeine:        '200mg'$ /day (equiv. To 1 cup of coffee or  approx. 3 12 oz sodas)         Gas: Cold/Hayfever:       Gas-X  Benadryl      Mylicon  Claritin       Phazyme  **Claritin-D        Chlor-Trimeton  Headaches:  Dimetapp      ASA-Free Excedrin  Drixoral-Non-Drowsy     Cold Compress  Mucinex (Guaifenasin)     Tylenol (Regular or Extra  Sudafed/Sudafed-12  Hour     Strength)  **Sudafed PE Pseudoephedrine   Tylenol Cold & Sinus     Vicks Vapor Rub  Zyrtec  **AVOID if Problems With Blood Pressure         Heartburn: Avoid lying down for at least 1 hour after meals  Aciphex      Maalox     Rash:  Milk of Magnesia     Benadryl    Mylanta       1% Hydrocortisone Cream  Pepcid  Pepcid Complete   Sleep Aids:  Prevacid      Ambien   Prilosec       Benadryl  Rolaids       Chamomile Tea  Tums (Limit 4/day)     Unisom  Zantac       Tylenol PM         Warm milk-add vanilla or  Hemorrhoids:       Sugar for taste  Anusol/Anusol H.C.  (RX: Analapram 2.5%)  Sugar Substitutes:  Hydrocortisone OTC     Ok in moderation  Preparation H      Tucks        Vaseline lotion applied to tissue with wiping    Herpes:     Throat:  Acyclovir      Oragel  Famvir  Valtrex     Vaccines:         Flu Shot Leg Cramps:       *Gardasil  Benadryl      Hepatitis A         Hepatitis B Nasal Spray:       Pneumovax  Saline Nasal Spray     Polio Booster         Tetanus Nausea:       Tuberculosis test or PPD  Vitamin B6 25 mg TID   AVOID:    Dramamine      *Gardasil  Emetrol       Live Poliovirus  Ginger Root 250 mg QID    MMR (measles, mumps &  High Complex Carbs @ Bedtime    rebella)  Sea Bands-Accupressure    Varicella (Chickenpox)  Unisom 1/2 tab TID     *No known complications           If received before Pain:         Known pregnancy;   Darvocet       Resume series after  Lortab        Delivery  Percocet    Yeast:   Tramadol      Femstat  Tylenol 3      Gyne-lotrimin  Ultram       Monistat  Vicodin           MISC:         All Sunscreens           Hair Coloring/highlights          Insect Repellant's          (Including DEET)         Mystic Tans

## 2017-02-20 ENCOUNTER — Encounter: Payer: Self-pay | Admitting: Certified Nurse Midwife

## 2017-02-20 ENCOUNTER — Other Ambulatory Visit: Payer: BLUE CROSS/BLUE SHIELD

## 2017-02-20 ENCOUNTER — Ambulatory Visit (INDEPENDENT_AMBULATORY_CARE_PROVIDER_SITE_OTHER): Payer: BLUE CROSS/BLUE SHIELD | Admitting: Certified Nurse Midwife

## 2017-02-20 ENCOUNTER — Encounter: Payer: BLUE CROSS/BLUE SHIELD | Admitting: Certified Nurse Midwife

## 2017-02-20 VITALS — BP 105/61 | HR 76 | Wt 195.7 lb

## 2017-02-20 DIAGNOSIS — Z23 Encounter for immunization: Secondary | ICD-10-CM

## 2017-02-20 DIAGNOSIS — Z3492 Encounter for supervision of normal pregnancy, unspecified, second trimester: Secondary | ICD-10-CM

## 2017-02-20 DIAGNOSIS — Z130 Encounter for screening for diseases of the blood and blood-forming organs and certain disorders involving the immune mechanism: Secondary | ICD-10-CM

## 2017-02-20 DIAGNOSIS — Z131 Encounter for screening for diabetes mellitus: Secondary | ICD-10-CM

## 2017-02-20 LAB — POCT URINALYSIS DIPSTICK
Bilirubin, UA: NEGATIVE
Glucose, UA: NEGATIVE
LEUKOCYTES UA: NEGATIVE
Nitrite, UA: NEGATIVE
ODOR: NEGATIVE
PH UA: 6.5 (ref 5.0–8.0)
Protein, UA: NEGATIVE
Spec Grav, UA: 1.025 (ref 1.010–1.025)
UROBILINOGEN UA: 0.2 U/dL

## 2017-02-20 NOTE — Progress Notes (Signed)
ROB, doing well. 1 hr Gtt/cbc/RPR/BTC/Tdap today. Discussed cord blood donation (private and public) and birth control after deliver. She would like to use the nuvaring it works well for her in the past. She is feeling fetal movement and denies contractions. She will follow up in 2 wks.   Pattricia BossAnnie Hollyn Stucky,CNM

## 2017-02-20 NOTE — Progress Notes (Signed)
ROB- gtt, tdap, btc- done. Back pain seem to be getting worse.

## 2017-02-20 NOTE — Patient Instructions (Signed)

## 2017-02-21 LAB — RPR: RPR: NONREACTIVE

## 2017-02-21 LAB — GLUCOSE, 1 HOUR GESTATIONAL: Gestational Diabetes Screen: 126 mg/dL (ref 65–139)

## 2017-02-21 LAB — CBC
HEMATOCRIT: 36.2 % (ref 34.0–46.6)
HEMOGLOBIN: 11.9 g/dL (ref 11.1–15.9)
MCH: 26.7 pg (ref 26.6–33.0)
MCHC: 32.9 g/dL (ref 31.5–35.7)
MCV: 81 fL (ref 79–97)
Platelets: 254 10*3/uL (ref 150–379)
RBC: 4.46 x10E6/uL (ref 3.77–5.28)
RDW: 14.9 % (ref 12.3–15.4)
WBC: 11 10*3/uL — AB (ref 3.4–10.8)

## 2017-03-10 NOTE — L&D Delivery Note (Signed)
[name]  Labor Events:   Preterm labor:   Rupture date:   Rupture time:   Rupture type: Artificial  Fluid Color: Clear  Induction:   Augmentation:   Complications:   Cervical ripening:          Delivery:   Episiotomy:   Lacerations:   Repair suture:   Repair # of packets:   Blood loss (ml): 700   Information for the patient's newborn:  Carly Henry, Carly Henry [[id number]]    Delivery 05/28/2017 3:12 AM by  C-Section, Low Transverse Sex:  female Gestational Age: 3131w1d Delivery Clinician:   Living?:         APGARS  One minute Five minutes Ten minutes  Skin color:        Heart rate:        Grimace:        Muscle tone:        Breathing:        Totals: 9  9      Presentation/position:      Resuscitation:   Cord information:    Disposition of cord blood:     Blood gases sent?  Complications:   Placenta: Delivered:       appearance Newborn Measurements: Weight: 7 lb 0.9 oz (3200 g)  Height: 19.88"  Head circumference:    Chest circumference:    Other providers:    Additional  information: Forceps:   Vacuum:   Breech:   Observed anomalies        See Dr. Oretha Milchherry's C-section note for details of procedure.    Hildred Laserherry, Leeba Barbe, MD [facility]

## 2017-03-11 ENCOUNTER — Ambulatory Visit (INDEPENDENT_AMBULATORY_CARE_PROVIDER_SITE_OTHER): Payer: BLUE CROSS/BLUE SHIELD | Admitting: Obstetrics and Gynecology

## 2017-03-11 VITALS — BP 110/67 | HR 83 | Wt 199.9 lb

## 2017-03-11 DIAGNOSIS — Z3493 Encounter for supervision of normal pregnancy, unspecified, third trimester: Secondary | ICD-10-CM

## 2017-03-11 LAB — POCT URINALYSIS DIPSTICK
Bilirubin, UA: NEGATIVE
Blood, UA: NEGATIVE
GLUCOSE UA: NEGATIVE
KETONES UA: NEGATIVE
Nitrite, UA: NEGATIVE
SPEC GRAV UA: 1.01 (ref 1.010–1.025)
Urobilinogen, UA: 0.2 E.U./dL
pH, UA: 6 (ref 5.0–8.0)

## 2017-03-11 NOTE — Progress Notes (Signed)
ROB- mom and spouse support for labor, doing well, encouraged enrolling in classes. Agreed to deliver if I am in town.

## 2017-03-11 NOTE — Progress Notes (Signed)
ROB- pt is doing well 

## 2017-03-23 ENCOUNTER — Encounter: Payer: Self-pay | Admitting: Certified Nurse Midwife

## 2017-03-23 ENCOUNTER — Ambulatory Visit (INDEPENDENT_AMBULATORY_CARE_PROVIDER_SITE_OTHER): Payer: BLUE CROSS/BLUE SHIELD | Admitting: Certified Nurse Midwife

## 2017-03-23 VITALS — BP 109/72 | HR 84 | Wt 198.4 lb

## 2017-03-23 DIAGNOSIS — Z3493 Encounter for supervision of normal pregnancy, unspecified, third trimester: Secondary | ICD-10-CM

## 2017-03-23 NOTE — Progress Notes (Signed)
ROB doing well. No complaints. Feels good fetal movement and denies contractions. Follow up 1 wks.  Doreene BurkeAnnie Lyrika Henry, CNM

## 2017-03-23 NOTE — Patient Instructions (Signed)

## 2017-03-23 NOTE — Progress Notes (Signed)
ROB- pt is doing well 

## 2017-04-06 ENCOUNTER — Ambulatory Visit (INDEPENDENT_AMBULATORY_CARE_PROVIDER_SITE_OTHER): Payer: BLUE CROSS/BLUE SHIELD | Admitting: Certified Nurse Midwife

## 2017-04-06 VITALS — BP 125/65 | HR 93 | Wt 201.7 lb

## 2017-04-06 DIAGNOSIS — Z3493 Encounter for supervision of normal pregnancy, unspecified, third trimester: Secondary | ICD-10-CM

## 2017-04-06 LAB — POCT URINALYSIS DIPSTICK
Bilirubin, UA: NEGATIVE
Blood, UA: NEGATIVE
Glucose, UA: NEGATIVE
Ketones, UA: NEGATIVE
LEUKOCYTES UA: NEGATIVE
NITRITE UA: NEGATIVE
Protein, UA: NEGATIVE
SPEC GRAV UA: 1.015 (ref 1.010–1.025)
Urobilinogen, UA: 0.2 E.U./dL
pH, UA: 6 (ref 5.0–8.0)

## 2017-04-06 NOTE — Progress Notes (Signed)
ROB- pt is having L hip pain, also states she is having some pelvic pressure

## 2017-04-06 NOTE — Patient Instructions (Signed)
Back Pain in Pregnancy Back pain during pregnancy is common. Back pain may be caused by several factors that are related to changes during your pregnancy. Follow these instructions at home: Managing pain, stiffness, and swelling  If directed, apply ice for sudden (acute) back pain. ? Put ice in a plastic bag. ? Place a towel between your skin and the bag. ? Leave the ice on for 20 minutes, 2-3 times per day.  If directed, apply heat to the affected area before you exercise: ? Place a towel between your skin and the heat pack or heating pad. ? Leave the heat on for 20-30 minutes. ? Remove the heat if your skin turns bright red. This is especially important if you are unable to feel pain, heat, or cold. You may have a greater risk of getting burned. Activity  Exercise as told by your health care provider. Exercising is the best way to prevent or manage back pain.  Listen to your body when lifting. If lifting hurts, ask for help or bend your knees. This uses your leg muscles instead of your back muscles.  Squat down when picking up something from the floor. Do not bend over.  Only use bed rest as told by your health care provider. Bed rest should only be used for the most severe episodes of back pain. Standing, Sitting, and Lying Down  Do not stand in one place for long periods of time.  Use good posture when sitting. Make sure your head rests over your shoulders and is not hanging forward. Use a pillow on your lower back if necessary.  Try sleeping on your side, preferably the left side, with a pillow or two between your legs. If you are sore after a night's rest, your bed may be too soft. A firm mattress may provide more support for your back during pregnancy. General instructions  Do not wear high heels.  Eat a healthy diet. Try to gain weight within your health care provider's recommendations.  Use a maternity girdle, elastic sling, or back brace as told by your health care  provider.  Take over-the-counter and prescription medicines only as told by your health care provider.  Keep all follow-up visits as told by your health care provider. This is important. This includes any visits with any specialists, such as a physical therapist. Contact a health care provider if:  Your back pain interferes with your daily activities.  You have increasing pain in other parts of your body. Get help right away if:  You develop numbness, tingling, weakness, or problems with the use of your arms or legs.  You develop severe back pain that is not controlled with medicine.  You have a sudden change in bowel or bladder control.  You develop shortness of breath, dizziness, or you faint.  You develop nausea, vomiting, or sweating.  You have back pain that is a rhythmic, cramping pain similar to labor pains. Labor pain is usually 1-2 minutes apart, lasts for about 1 minute, and involves a bearing down feeling or pressure in your pelvis.  You have back pain and your water breaks or you have vaginal bleeding.  You have back pain or numbness that travels down your leg.  Your back pain developed after you fell.  You develop pain on one side of your back.  You see blood in your urine.  You develop skin blisters in the area of your back pain. This information is not intended to replace advice given to you   by your health care provider. Make sure you discuss any questions you have with your health care provider. Document Released: 06/04/2005 Document Revised: 08/02/2015 Document Reviewed: 11/08/2014 Elsevier Interactive Patient Education  2018 Elsevier Inc[employer]  Common Medications Safe in Pregnancy  Acne:      Constipation:  Benzoyl Peroxide     Colace  Clindamycin      Dulcolax Suppository  Topica Erythromycin     Fibercon  Salicylic Acid      Metamucil         Miralax AVOID:        Senakot   Accutane    Cough:  Retin-A       Cough Drops  Tetracycline      Phenergan w/  Codeine if Rx  Minocycline      Robitussin (Plain & DM)  Antibiotics:     Crabs/Lice:  Ceclor       RID  Cephalosporins    AVOID:  E-Mycins      Kwell  Keflex  Macrobid/Macrodantin   Diarrhea:  Penicillin      Kao-Pectate  Zithromax      Imodium AD         PUSH FLUIDS AVOID:       Cipro     Fever:  Tetracycline      Tylenol (Regular or Extra  Minocycline       Strength)  Levaquin      Extra Strength-Do not          Exceed 8 tabs/24 hrs Caffeine:        <200mg/day (equiv. To 1 cup of coffee or  approx. 3 12 oz sodas)         Gas: Cold/Hayfever:       Gas-X  Benadryl      Mylicon  Claritin       Phazyme  **Claritin-D        Chlor-Trimeton    Headaches:  Dimetapp      ASA-Free Excedrin  Drixoral-Non-Drowsy     Cold Compress  Mucinex (Guaifenasin)     Tylenol (Regular or Extra  Sudafed/Sudafed-12 Hour     Strength)  **Sudafed PE Pseudoephedrine   Tylenol Cold & Sinus     Vicks Vapor Rub  Zyrtec  **AVOID if Problems With Blood Pressure         Heartburn: Avoid lying down for at least 1 hour after meals  Aciphex      Maalox     Rash:  Milk of Magnesia     Benadryl    Mylanta       1% Hydrocortisone Cream  Pepcid  Pepcid Complete   Sleep Aids:  Prevacid      Ambien   Prilosec       Benadryl  Rolaids       Chamomile Tea  Tums (Limit 4/day)     Unisom  Zantac       Tylenol PM         Warm milk-add vanilla or  Hemorrhoids:       Sugar for taste  Anusol/Anusol H.C.  (RX: Analapram 2.5%)  Sugar Substitutes:  Hydrocortisone OTC     Ok in moderation  Preparation H      Tucks        Vaseline lotion applied to tissue with wiping    Herpes:     Throat:  Acyclovir      Oragel  Famvir  Valtrex     Vaccines:           Flu Shot Leg Cramps:       *Gardasil  Benadryl      Hepatitis A         Hepatitis B Nasal Spray:       Pneumovax  Saline Nasal Spray     Polio Booster         Tetanus Nausea:       Tuberculosis test or PPD  Vitamin B6 25 mg  TID   AVOID:    Dramamine      *Gardasil  Emetrol       Live Poliovirus  Ginger Root 250 mg QID    MMR (measles, mumps &  High Complex Carbs @ Bedtime    rebella)  Sea Bands-Accupressure    Varicella (Chickenpox)  Unisom 1/2 tab TID     *No known complications           If received before Pain:         Known pregnancy;   Darvocet       Resume series after  Lortab        Delivery  Percocet    Yeast:   Tramadol      Femstat  Tylenol 3      Gyne-lotrimin  Ultram       Monistat  Vicodin           MISC:         All Sunscreens           Hair Coloring/highlights          Insect Repellant's          (Including DEET)         Mystic Tans Sciatica Sciatica is pain, numbness, weakness, or tingling along your sciatic nerve. The sciatic nerve starts in the lower back and goes down the back of each leg. Sciatica happens when this nerve is pinched or has pressure put on it. Sciatica usually goes away on its own or with treatment. Sometimes, sciatica may keep coming back (recur). Follow these instructions at home: Medicines  Take over-the-counter and prescription medicines only as told by your doctor.  Do not drive or use heavy machinery while taking prescription pain medicine. Managing pain  If directed, put ice on the affected area. ? Put ice in a plastic bag. ? Place a towel between your skin and the bag. ? Leave the ice on for 20 minutes, 2-3 times a day.  After icing, apply heat to the affected area before you exercise or as often as told by your doctor. Use the heat source that your doctor tells you to use, such as a moist heat pack or a heating pad. ? Place a towel between your skin and the heat source. ? Leave the heat on for 20-30 minutes. ? Remove the heat if your skin turns bright red. This is especially important if you are unable to feel pain, heat, or cold. You may have a greater risk of getting burned. Activity  Return to your normal activities as told by your doctor. Ask  your doctor what activities are safe for you. ? Avoid activities that make your sciatica worse.  Take short rests during the day. Rest in a lying or standing position. This is usually better than sitting to rest. ? When you rest for a long time, do some physical activity or stretching between periods of rest. ? Avoid sitting for a long time without moving. Get up and move around at least one time each hour.  Exercise and stretch regularly, as told by your doctor.  Do not lift anything that is heavier than 10 lb (4.5 kg) while you have symptoms of sciatica. ? Avoid lifting heavy things even when you do not have symptoms. ? Avoid lifting heavy things over and over.  When you lift objects, always lift in a way that is safe for your body. To do this, you should: ? Bend your knees. ? Keep the object close to your body. ? Avoid twisting. General instructions  Use good posture. ? Avoid leaning forward when you are sitting. ? Avoid hunching over when you are standing.  Stay at a healthy weight.  Wear comfortable shoes that support your feet. Avoid wearing high heels.  Avoid sleeping on a mattress that is too soft or too hard. You might have less pain if you sleep on a mattress that is firm enough to support your back.  Keep all follow-up visits as told by your doctor. This is important. Contact a doctor if:  You have pain that: ? Wakes you up when you are sleeping. ? Gets worse when you lie down. ? Is worse than the pain you have had in the past. ? Lasts longer than 4 weeks.  You lose weight for without trying. Get help right away if:  You cannot control when you pee (urinate) or poop (have a bowel movement).  You have weakness in any of these areas and it gets worse. ? Lower back. ? Lower belly (pelvis). ? Butt (buttocks). ? Legs.  You have redness or swelling of your back.  You have a burning feeling when you pee. This information is not intended to replace advice given  to you by your health care provider. Make sure you discuss any questions you have with your health care provider. Document Released: 12/04/2007 Document Revised: 08/02/2015 Document Reviewed: 11/03/2014 Elsevier Interactive Patient Education  [employer].

## 2017-04-06 NOTE — Progress Notes (Signed)
[name] pain in left hip and buttocks. Discussed home treatment measures for sciatica, URL given for [employer] website. Brochure given for [facility]. Agreed to attend birth if Melody unavailable. Thoroughly reviewed red flag symptoms and when to call. RTC x 2-3 weeks for 36 week cultures and ROB or sooner if needed.

## 2017-04-09 ENCOUNTER — Emergency Department
Admission: EM | Admit: 2017-04-09 | Discharge: 2017-04-09 | Disposition: A | Discharge status: Other Acute Inpatient Hospital | Payer: BLUE CROSS/BLUE SHIELD | Source: Home / Self Care | Attending: Emergency Medicine | Admitting: Emergency Medicine

## 2017-04-09 ENCOUNTER — Other Ambulatory Visit: Payer: Self-pay

## 2017-04-09 ENCOUNTER — Observation Stay
Admission: EM | Admit: 2017-04-09 | Discharge: 2017-04-09 | Disposition: A | Discharge status: Home / Self Care | Payer: BLUE CROSS/BLUE SHIELD | Attending: Certified Nurse Midwife | Admitting: Certified Nurse Midwife

## 2017-04-09 ENCOUNTER — Encounter: Payer: Self-pay | Admitting: Emergency Medicine

## 2017-04-09 DIAGNOSIS — O26899 Other specified pregnancy related conditions, unspecified trimester: Secondary | ICD-10-CM

## 2017-04-09 DIAGNOSIS — R1010 Upper abdominal pain, unspecified: Secondary | ICD-10-CM

## 2017-04-09 DIAGNOSIS — O9989 Other specified diseases and conditions complicating pregnancy, childbirth and the puerperium: Secondary | ICD-10-CM

## 2017-04-09 DIAGNOSIS — Z3A00 Weeks of gestation of pregnancy not specified: Secondary | ICD-10-CM

## 2017-04-09 DIAGNOSIS — O26893 Other specified pregnancy related conditions, third trimester: Principal | ICD-10-CM | POA: Insufficient documentation

## 2017-04-09 DIAGNOSIS — R110 Nausea: Secondary | ICD-10-CM

## 2017-04-09 DIAGNOSIS — Z3A34 34 weeks gestation of pregnancy: Secondary | ICD-10-CM | POA: Insufficient documentation

## 2017-04-09 DIAGNOSIS — R109 Unspecified abdominal pain: Principal | ICD-10-CM

## 2017-04-09 DIAGNOSIS — Z79899 Other long term (current) drug therapy: Secondary | ICD-10-CM

## 2017-04-09 LAB — URINALYSIS, COMPLETE (UACMP) WITH MICROSCOPIC
BILIRUBIN URINE: NEGATIVE
Glucose, UA: NEGATIVE mg/dL
Hgb urine dipstick: NEGATIVE
Ketones, ur: NEGATIVE mg/dL
Nitrite: NEGATIVE
Protein, ur: NEGATIVE mg/dL
SPECIFIC GRAVITY, URINE: 1.012 (ref 1.005–1.030)
pH: 6 (ref 5.0–8.0)

## 2017-04-09 LAB — COMPREHENSIVE METABOLIC PANEL
ALT: 23 U/L (ref 14–54)
AST: 37 U/L (ref 15–41)
Albumin: 3.1 g/dL — ABNORMAL LOW (ref 3.5–5.0)
Alkaline Phosphatase: 109 U/L (ref 38–126)
Anion gap: 9 (ref 5–15)
BUN: 8 mg/dL (ref 6–20)
CHLORIDE: 109 mmol/L (ref 101–111)
CO2: 21 mmol/L — AB (ref 22–32)
CREATININE: 0.6 mg/dL (ref 0.44–1.00)
Calcium: 9.5 mg/dL (ref 8.9–10.3)
GFR calc Af Amer: >60 mL/min (ref >60)
GLUCOSE: 106 mg/dL — AB (ref 65–99)
Potassium: 4 mmol/L (ref 3.5–5.1)
Sodium: 139 mmol/L (ref 135–145)
Total Bilirubin: 0.7 mg/dL (ref 0.3–1.2)
Total Protein: 7.2 g/dL (ref 6.5–8.1)

## 2017-04-09 LAB — CBC
HCT: 37 % (ref 35.0–47.0)
Hemoglobin: 12.2 g/dL (ref 12.0–16.0)
MCH: 26.8 pg (ref 26.0–34.0)
MCHC: 33.1 g/dL (ref 32.0–36.0)
MCV: 81.2 fL (ref 80.0–100.0)
PLATELETS: 252 10*3/uL (ref 150–440)
RBC: 4.56 MIL/uL (ref 3.80–5.20)
RDW: 15.1 % — ABNORMAL HIGH (ref 11.5–14.5)
WBC: 11.3 10*3/uL — ABNORMAL HIGH (ref 3.6–11.0)

## 2017-04-09 MED ORDER — SIMETHICONE 80 MG PO CHEW: 80.0000 mg | CHEWABLE_TABLET | Freq: Four times a day (QID) | ORAL | Status: DC | PRN

## 2017-04-09 MED ORDER — ACETAMINOPHEN 500 MG PO TABS
1000.0000 mg | ORAL_TABLET | Freq: Once | ORAL | Status: AC
  Administered 2017-04-09: 1000 mg via ORAL
  Filled 2017-04-09: qty 2

## 2017-04-09 MED ORDER — ALUM & MAG HYDROXIDE-SIMETH 200-200-20 MG/5ML PO SUSP
ORAL | Status: AC
  Filled 2017-04-09: qty 30

## 2017-04-09 MED ORDER — ALUM & MAG HYDROXIDE-SIMETH 200-200-20 MG/5ML PO SUSP
30.0000 mL | ORAL | Status: DC | PRN
  Administered 2017-04-09: 30 mL via ORAL

## 2017-04-09 NOTE — ED Triage Notes (Signed)
Pt to triage via w/c with no distress noted; pt reports having sharp upper abd pain since awakening at 5am with "pressure to her shoulders"; [facility]; denies hx of same; [redacted]wks pregnant, G1[facility]; denies any pregnancy complications

## 2017-04-09 NOTE — ED Provider Notes (Signed)
[facility] Emergency Department Provider Note  ____________________________________________   First MD Initiated Contact with Patient 04/09/17 [phone]     (approximate)  I have reviewed the triage vital signs and the nursing notes.   HISTORY  Chief Complaint Abdominal Pain   HPI Carly Henry is a 22 y.o. female who self presents to the emergency department with roughly 1 hour of insidious onset gradually progressive intermittent moderate severity upper abdominal cramping pain.  Associated with nausea but no vomiting.  She denies chest pain.  The pain comes in waves and when she has the pain she feels like it "takes my breath away".  She denies leg swelling.  No history of DVT or pulmonary embolism.  She is roughly [redacted] weeks pregnant with her first pregnancy.  Desired pregnancy.  She has no recent upper respiratory tract symptoms.  No fevers or chills.  She attempted to take a hot shower today which did not help.  Nothing in particular seems to make the symptoms better or worse.  Past Medical History:  Diagnosis Date  . Migraine     Patient Active Problem List   Diagnosis Date Noted  . Obesity (BMI 30-39.9) 12/26/2016  . Chronic migraine w/o aura w/o status migrainosus, not intractable 01/26/2016  . Generalized anxiety disorder 01/26/2016    Past Surgical History:  Procedure Laterality Date  . no surgerical history      Prior to Admission medications   Medication Sig Start Date End Date Taking? Authorizing Provider  ondansetron (ZOFRAN ODT) 4 MG disintegrating tablet Take 1 tablet (4 mg total) every 6 (six) hours as needed by mouth for nausea. Patient not taking: Reported on 03/11/2017 01/23/17   Gunnar Bulla, CNM  Prenatal Vit-Fe Fumarate-FA (PRENATAL VITAMINS) 28-0.8 MG TABS Take by mouth.    [provider]    Allergies Patient has no known allergies.  Family History  Problem Relation Age of Onset  . Gestational  diabetes Mother   . Migraines Mother   . High Cholesterol Mother   . High Cholesterol Father   . Diabetes Maternal Grandmother   . Diabetes Maternal Grandfather     Social History Social History   Tobacco Use  . Smoking status: Never Smoker  . Smokeless tobacco: Never Used  Substance Use Topics  . Alcohol use: Yes    Comment: Rare  . Drug use: No    Review of Systems Constitutional: No fever/chills Eyes: No visual changes. ENT: No sore throat. Cardiovascular: Denies chest pain. Respiratory: Positive for shortness of breath. Gastrointestinal: Positive for abdominal pain.  Positive for nausea, no vomiting.  No diarrhea.  No constipation. Genitourinary: Negative for dysuria. Musculoskeletal: Negative for back pain. Skin: Negative for rash. Neurological: Negative for headaches, focal weakness or numbness.   ____________________________________________   PHYSICAL EXAM:  VITAL SIGNS: ED Triage Vitals  Enc Vitals Group     BP 04/09/17 0607 125/79     Pulse Rate 04/09/17 0607 (!) 109     Resp 04/09/17 0607 20     Temp 04/09/17 0607 97.6 F (36.4 C)     Temp Source 04/09/17 0607 Oral     SpO2 04/09/17 0607 97 %     Weight 04/09/17 0606 201 lb (91.2 kg)     Height 04/09/17 0606 5\' 1"  (1.549 m)     Head Circumference --      Peak Flow --      Pain Score 04/09/17 0606 10     Pain  Loc --      Pain Edu? --      Excl. in GC? --     Constitutional: Alert and oriented x4 appears somewhat uncomfortable nontoxic no diaphoresis speaks full clear sentences Eyes: PERRL EOMI. Head: Atraumatic. Nose: No congestion/rhinnorhea. Mouth/Throat: No trismus uvula midline no pharyngeal erythema or exudate Neck: No stridor.   Cardiovascular: Tachycardic rate, regular rhythm. Grossly normal heart sounds.  Good peripheral circulation. Respiratory: Normal respiratory effort.  No retractions. Lungs CTAB and moving good air Gastrointestinal: Gravid abdomen soft nontender Musculoskeletal:  No lower extremity edema   Neurologic:  Normal speech and language. No gross focal neurologic deficits are appreciated. Skin:  Skin is warm, dry and intact. No rash noted. Psychiatric: Mood and affect are normal. Speech and behavior are normal.    ____________________________________________   DIFFERENTIAL includes but not limited to  [facility], labor, cholecystitis, biliary colic, gastric reflux, esophagitis ____________________________________________   LABS (all labs ordered are listed, but only abnormal results are displayed)  Labs Reviewed - No data to display   __________________________________________  EKG  ED ECG REPORT I, Merrily BrittleNeil Madelin Weseman, the attending physician, personally viewed and interpreted this ECG.  Date: 04/09/2017 EKG Time:  Rate: 94 Rhythm: normal sinus rhythm QRS Axis: normal Intervals: normal ST/T Wave abnormalities: normal Narrative Interpretation: no evidence of acute ischemia.  Single premature ventricular contraction  ____________________________________________  RADIOLOGY   ____________________________________________   PROCEDURES  Procedure(s) performed: no  Procedures  Critical Care performed: no  Observation: no ____________________________________________   INITIAL IMPRESSION / ASSESSMENT AND PLAN / ED COURSE  Pertinent labs & imaging results that were available during my care of the patient were reviewed by me and considered in my medical decision making (see chart for details).  The patient arrives with upper abdominal cramping sensation associated with nausea.  I appreciate she reports shortness of breath but this is when she has waves of pain.  She is afebrile with no cough lungs are clear.  Do not suspect pulmonary embolism or acute infectious etiology of her lungs.  At this point she is medically stable for evaluation in labor and delivery for her third trimester abdominal  pain.  ____________________________________________   FINAL CLINICAL IMPRESSION(S) / ED DIAGNOSES  Final diagnoses:  Abdominal pain affecting pregnancy      NEW MEDICATIONS STARTED DURING THIS VISIT:  New Prescriptions   No medications on file     Note:  This document was prepared using Dragon voice recognition software and may include unintentional dictation errors.     Merrily Brittleifenbark, Davelyn Gwinn, MD 04/09/17 [phone]

## 2017-04-09 NOTE — OB Triage Note (Signed)
L&D OB Triage Note  SUBJECTIVE Carly Henry is a 22 y.o. G1P0 female at 7121w1d, EDD Estimated Date of Delivery: 05/20/17 who presented to triage with complaints of SOB and upper abdominal pain . Pt was cleared in the ED.   Obstetric History   G1   P0   T0   P0   A0   L0    SAB0   TAB0   Ectopic0   Multiple0   Live Births0     # Outcome Date GA Lbr Len/2nd Weight Sex Delivery Anes PTL Lv  1 Current               No medications prior to admission.     OBJECTIVE  Nursing Evaluation:   BP 120/61 (BP Location: Left Arm)   Pulse 83   Temp 98 F (36.7 C) (Oral)   Resp 18   LMP 08/04/2016 (Exact Date)    Findings:  indigestion   NST was performed and has been reviewed by me.  NST INTERPRETATION: Category I  Mode: External Baseline Rate (A): 125 bpm(FHT) Variability: Moderate Accelerations: 15 x 15 Decelerations: absent     Contraction Frequency (min): None  CBC    Component Value Date/Time   WBC 11.3 (H) 04/09/2017 0835   RBC 4.56 04/09/2017 0835   HGB 12.2 04/09/2017 0835   HGB 11.9 02/20/2017 1547   HCT 37.0 04/09/2017 0835   HCT 36.2 02/20/2017 1547   PLT 252 04/09/2017 0835   PLT 254 02/20/2017 1547   MCV 81.2 04/09/2017 0835   MCV 81 02/20/2017 1547   MCH 26.8 04/09/2017 0835   MCHC 33.1 04/09/2017 0835   RDW 15.1 (H) 04/09/2017 0835   RDW 14.9 02/20/2017 1547   LYMPHSABS 2.6 10/28/2016 1617   EOSABS 0.5 (H) 10/28/2016 1617   BASOSABS 0.0 10/28/2016 1617   CMP Latest Ref Rng & Units 04/09/2017 09/22/2016  Glucose 65 - 99 mg/dL 161(W106(H) 960(A113(H)  BUN 6 - 20 mg/dL 8 10  Creatinine 5.400.44 - 1.00 mg/dL 9.810.60 1.910.54  Sodium 478135 - 145 mmol/L 139 136  Potassium 3.5 - 5.1 mmol/L 4.0 3.3(L)  Chloride 101 - 111 mmol/L 109 105  CO2 22 - 32 mmol/L 21(L) 21(L)  Calcium 8.9 - 10.3 mg/dL 9.5 9.0  Total Protein 6.5 - 8.1 g/dL 7.2 7.4  Total Bilirubin 0.3 - 1.2 mg/dL 0.7 0.6  Alkaline Phos 38 - 126 U/L 109 65  AST 15 - 41 U/L 37 25  ALT 14 - 54 U/L 23 19     ASSESSMENT Impression:  1.  Pregnancy:  G1P0 at 8621w1d , EDD Estimated Date of Delivery: 05/20/17 2.  NST:  Reactive 3. Resolution of pain with Maalox/Mylanta  PLAN 1. Reassurance given 2. Discharge home with standard labor precautions given to return to L&D or call the office for problems.  3. Continue routine prenatal care.  Doreene BurkeAnnie Sparkle Aube, CNM

## 2017-04-09 NOTE — OB Triage Note (Signed)
Patient came in after being cleared and discharge from the ED for SOB and upper abdominal pain. I was reported that EKG was performed and that look clear. Also reported from ED that patient was given tylenol. See ED note for patient's chief complaint.  Patient now no longer complaining of SOB. Patient states that will occasional feel tightness in her upper abdomen that will take her breath away. Rating pain now a 8 out of 10. Patient denies LOF and also denies vaginal bleeding at this time. Reports positive FM at this time, however, was feeling decrease fetal movement prior to triage at birthplace. Monitors placed and assessing. Currently patient does not look in distress.

## 2017-04-09 NOTE — Discharge Instructions (Signed)
Fortunately today your EKG was very reassuring and your lungs sound.  Please go directly to labor and delivery for further evaluation of your abdominal pain.  It was a pleasure to take care of you today, and thank you for coming to our emergency department.  If you have any questions or concerns before leaving please ask the nurse to grab me and I'm more than happy to go through your aftercare instructions again.

## 2017-04-09 NOTE — Discharge Instructions (Signed)
Heartburn During Pregnancy Heartburn is pain or discomfort in the throat or chest. It may cause a burning feeling. It happens when stomach acid moves up into the tube that carries food from your mouth to your stomach (esophagus). Heartburn is common during pregnancy. It usually goes away or gets better after giving birth. Follow these instructions at home: Eating and drinking  Do not drink alcohol while you are pregnant.  Figure out which foods and beverages make you feel worse, and avoid them.  Beverages that you may want to avoid include: ? Coffee and tea (with or without caffeine). ? Energy drinks and sports drinks. ? Bubbly (carbonated) drinks or sodas. ? Citrus fruit juices.  Foods that you may want to avoid include: ? Chocolate and cocoa. ? Peppermint and mint flavorings. ? Garlic, onions, and horseradish. ? Spicy and acidic foods. These include peppers, chili powder, curry powder, vinegar, hot sauces, and barbecue sauce. ? Citrus fruits, such as oranges, lemons, and limes. ? Tomato-based foods, such as red sauce, chili, and salsa. ? Fried and fatty foods, such as donuts, french fries, potato chips, and high-fat dressings. ? High-fat meats, such as hot dogs, cold cuts, sausage, ham, and bacon. ? High-fat dairy items, such as whole milk, butter, and cheese.  Eat small meals often, instead of large meals.  Avoid drinking a lot of liquid with your meals.  Avoid eating meals during the 2-3 hours before you go to bed.  Avoid lying down right after you eat.  Do not exercise right after you eat. Medicines  Take over-the-counter and prescription medicines only as told by your doctor.  Do not take aspirin, ibuprofen, or other NSAIDs unless your doctor tells you to do that.  Your doctor may tell you to avoid medicines that have sodium bicarbonate in them. General instructions  If told, raise the head of your bed about 6 inches (15 cm). You can do this by putting blocks under  the legs. Sleeping with more pillows does not help with heartburn.  Do not use any products that contain nicotine or tobacco, such as cigarettes and e-cigarettes. If you need help quitting, ask your doctor.  Wear loose-fitting clothing.  Try to lower your stress, such as with yoga or meditation. If you need help, ask your doctor.  Stay at a healthy weight. If you are overweight, work with your doctor to safely lose weight.  Keep all follow-up visits as told by your doctor. This is important. Contact a doctor if:  You get new symptoms.  Your symptoms do not get better with treatment.  You have weight loss and you do not know why.  You have trouble swallowing.  You make loud sounds when you breathe (wheeze).  You have a cough that does not go away.  You have heartburn often for more than 2 weeks.  You feel sick to your stomach (nauseous), and this does not get better with treatment.  You are throwing up (vomiting), and this does not get better with treatment.  You have pain in your belly (abdomen). Get help right away if:  You have very bad chest pain that spreads to your arm, neck, or jaw.  You feel sweaty, dizzy, or light-headed.  You have trouble breathing.  You have pain when swallowing.  You throw up and your throw-up looks like blood or coffee grounds.  Your poop (stool) is bloody or black. This information is not intended to replace advice given to you by your health care provider.   Make sure you discuss any questions you have with your health care provider. Document Released: 03/29/2010 Document Revised: 11/12/2015 Document Reviewed: 11/12/2015 Elsevier Interactive Patient Education  2017 Else[employer] Inc.  

## 2017-04-09 NOTE — ED Notes (Signed)
Called LDR to give report on patient and they requested we evaluate the patient for her shob before sending her.

## 2017-04-20 ENCOUNTER — Ambulatory Visit (INDEPENDENT_AMBULATORY_CARE_PROVIDER_SITE_OTHER): Payer: BLUE CROSS/BLUE SHIELD | Admitting: Certified Nurse Midwife

## 2017-04-20 VITALS — BP 105/68 | HR 67 | Wt 200.7 lb

## 2017-04-20 DIAGNOSIS — Z3483 Encounter for supervision of other normal pregnancy, third trimester: Secondary | ICD-10-CM

## 2017-04-20 LAB — POCT URINALYSIS DIPSTICK
Bilirubin, UA: NEGATIVE
Glucose, UA: NEGATIVE
Ketones, UA: NEGATIVE
LEUKOCYTES UA: NEGATIVE
Nitrite, UA: NEGATIVE
PH UA: 6.5 (ref 5.0–8.0)
PROTEIN UA: NEGATIVE
RBC UA: NEGATIVE
Spec Grav, UA: 1.01 (ref 1.010–1.025)
UROBILINOGEN UA: 0.2 U/dL

## 2017-04-20 NOTE — Progress Notes (Signed)
Pt stated that she was having sinus pressure in her nose ears and her throat is hurting at night.

## 2017-04-20 NOTE — Addendum Note (Signed)
Addended by: Mechele ClaudeHOMPSON, Hedda Crumbley M on: 04/20/2017 11:48 AM   Modules accepted: Orders

## 2017-04-20 NOTE — Patient Instructions (Signed)
Group B Streptococcus Infection During Pregnancy Group B Streptococcus (GBS) is a type of bacteria (Streptococcus agalactiae) that is often found in healthy people, commonly in the rectum, vagina, and intestines. In people who are healthy and not pregnant, the bacteria rarely cause serious illness or complications. However, women who test positive for GBS during pregnancy can pass the bacteria to their baby during childbirth, which can cause serious infection in the baby after birth. Women with GBS may also have infections during their pregnancy or immediately after childbirth, such as such as urinary tract infections (UTIs) or infections of the uterus (uterine infections). Having GBS also increases a woman's risk of complications during pregnancy, such as early (preterm) labor or delivery, miscarriage, or stillbirth. Routine testing (screening) for GBS is recommended for all pregnant women. What increases the risk? You may have a higher risk for GBS infection during pregnancy if you had one during a past pregnancy. What are the signs or symptoms? In most cases, GBS infection does not cause symptoms in pregnant women. Signs and symptoms of a possible GBS-related infection may include:  Labor starting before the 37th week of pregnancy.  A UTI or bladder infection, which may cause: ? Fever. ? Pain or burning during urination. ? Frequent urination.  Fever during labor, along with: ? Bad-smelling discharge. ? Uterine tenderness. ? Rapid heartbeat in the mother, baby, or both.  Rare but serious symptoms of a possible GBS-related infection in women include:  Blood infection (septicemia). This may cause fever, chills, or confusion.  Lung infection (pneumonia). This may cause fever, chills, cough, rapid breathing, difficulty breathing, or chest pain.  Bone, joint, skin, or soft tissue infection.  How is this diagnosed? You may be screened for GBS between week 35 and week 37 of your pregnancy. If  you have symptoms of preterm labor, you may be screened earlier. This condition is diagnosed based on lab test results from:  A swab of fluid from the vagina and rectum.  A urine sample.  How is this treated? This condition is treated with antibiotic medicine. When you go into labor, or as soon as your water breaks (your membranes rupture), you will be given antibiotics through an IV tube. Antibiotics will continue until after you give birth. If you are having a cesarean delivery, you do not need antibiotics unless your membranes have already ruptured. Follow these instructions at home:  Take over-the-counter and prescription medicines only as told by your health care provider.  Take your antibiotic medicine as told by your health care provider. Do not stop taking the antibiotic even if you start to feel better.  Keep all pre-birth (prenatal) visits and follow-up visits as told by your health care provider. This is important. Contact a health care provider if:  You have pain or burning when you urinate.  You have to urinate frequently.  You have a fever or chills.  You develop a bad-smelling vaginal discharge. Get help right away if:  Your membranes rupture.  You go into labor.  You have severe pain in your abdomen.  You have difficulty breathing.  You have chest pain. This information is not intended to replace advice given to you by your health care provider. Make sure you discuss any questions you have with your health care provider. Document Released: 06/03/2007 Document Revised: 09/21/2015 Document Reviewed: 09/20/2015 Elsevier Interactive Patient Education  2018 [employer]  

## 2017-04-20 NOTE — Progress Notes (Signed)
ROB doing well. GBS and cultures today. SVE 1 cm/thick/out of pelvis. Feels good movement. Denies contractions. Follow up 1 wk.  Doreene BurkeAnnie Jelina Paulsen, CNM

## 2017-04-23 ENCOUNTER — Telehealth: Payer: Self-pay | Admitting: Certified Nurse Midwife

## 2017-04-23 LAB — NUSWAB VAGINITIS PLUS (VG+)
CANDIDA GLABRATA, NAA: NEGATIVE
CHLAMYDIA TRACHOMATIS, NAA: NEGATIVE
Candida albicans, NAA: POSITIVE — AB
Neisseria gonorrhoeae, NAA: NEGATIVE
TRICH VAG BY NAA: NEGATIVE

## 2017-04-23 NOTE — Telephone Encounter (Signed)
Pt states she went to urinate and saw a bloody mucous. About the size of 1/2 dollar. She took a shower and saw red on her finger from the vagina. Now it is only on the TP when she wipes. Lite. NO ctx or lof. Pos fm. NO n.v.d. NO fevers. NO uti sx. NO vaginal discharge. Pt advised to monitor. If bleeding like a period, having contractions, or decreased fetal movement she will need to go to ed or contact office.

## 2017-04-23 NOTE — Telephone Encounter (Signed)
The patient called and stated that she would like to speak with a nurse in regards to her bleeding after she uses the restroom and wiping. The patient is [redacted] weeks pregnant would like to speak with someone as soon as possible. Please advise.

## 2017-04-24 ENCOUNTER — Other Ambulatory Visit: Payer: Self-pay | Admitting: Certified Nurse Midwife

## 2017-04-24 ENCOUNTER — Telehealth: Payer: Self-pay

## 2017-04-24 LAB — CULTURE, BETA STREP (GROUP B ONLY): STREP GP B CULTURE: NEGATIVE

## 2017-04-24 MED ORDER — FLUCONAZOLE 150 MG PO TABS: 150.0000 mg | ORAL_TABLET | Freq: Once | ORAL | 0 refills | Status: AC

## 2017-04-24 NOTE — Telephone Encounter (Signed)
Pt informed

## 2017-04-24 NOTE — Progress Notes (Signed)
Nuswab positive for yeast. Prescription sent to pharmacy on file.  Pt notified via my chart.   Doreene BurkeAnnie Khiara Shuping<, CNM

## 2017-04-27 ENCOUNTER — Ambulatory Visit (INDEPENDENT_AMBULATORY_CARE_PROVIDER_SITE_OTHER): Payer: BLUE CROSS/BLUE SHIELD | Admitting: Certified Nurse Midwife

## 2017-04-27 VITALS — BP 117/72 | HR 99 | Wt 203.4 lb

## 2017-04-27 DIAGNOSIS — O26899 Other specified pregnancy related conditions, unspecified trimester: Secondary | ICD-10-CM

## 2017-04-27 DIAGNOSIS — G5600 Carpal tunnel syndrome, unspecified upper limb: Secondary | ICD-10-CM | POA: Insufficient documentation

## 2017-04-27 NOTE — Patient Instructions (Addendum)
Sciatica Sciatica is pain, numbness, weakness, or tingling along your sciatic nerve. The sciatic nerve starts in the lower back and goes down the back of each leg. Sciatica happens when this nerve is pinched or has pressure put on it. Sciatica usually goes away on its own or with treatment. Sometimes, sciatica may keep coming back (recur). Follow these instructions at home: Medicines  Take over-the-counter and prescription medicines only as told by your doctor.  Do not drive or use heavy machinery while taking prescription pain medicine. Managing pain  If directed, put ice on the affected area. ? Put ice in a plastic bag. ? Place a towel between your skin and the bag. ? Leave the ice on for 20 minutes, 2-3 times a day.  After icing, apply heat to the affected area before you exercise or as often as told by your doctor. Use the heat source that your doctor tells you to use, such as a moist heat pack or a heating pad. ? Place a towel between your skin and the heat source. ? Leave the heat on for 20-30 minutes. ? Remove the heat if your skin turns bright red. This is especially important if you are unable to feel pain, heat, or cold. You may have a greater risk of getting burned. Activity  Return to your normal activities as told by your doctor. Ask your doctor what activities are safe for you. ? Avoid activities that make your sciatica worse.  Take short rests during the day. Rest in a lying or standing position. This is usually better than sitting to rest. ? When you rest for a long time, do some physical activity or stretching between periods of rest. ? Avoid sitting for a long time without moving. Get up and move around at least one time each hour.  Exercise and stretch regularly, as told by your doctor.  Do not lift anything that is heavier than 10 lb (4.5 kg) while you have symptoms of sciatica. ? Avoid lifting heavy things even when you do not have symptoms. ? Avoid lifting heavy  things over and over.  When you lift objects, always lift in a way that is safe for your body. To do this, you should: ? Bend your knees. ? Keep the object close to your body. ? Avoid twisting. General instructions  Use good posture. ? Avoid leaning forward when you are sitting. ? Avoid hunching over when you are standing.  Stay at a healthy weight.  Wear comfortable shoes that support your feet. Avoid wearing high heels.  Avoid sleeping on a mattress that is too soft or too hard. You might have less pain if you sleep on a mattress that is firm enough to support your back.  Keep all follow-up visits as told by your doctor. This is important. Contact a doctor if:  You have pain that: ? Wakes you up when you are sleeping. ? Gets worse when you lie down. ? Is worse than the pain you have had in the past. ? Lasts longer than 4 weeks.  You lose weight for without trying. Get help right away if:  You cannot control when you pee (urinate) or poop (have a bowel movement).  You have weakness in any of these areas and it gets worse. ? Lower back. ? Lower belly (pelvis). ? Butt (buttocks). ? Legs.  You have redness or swelling of your back.  You have a burning feeling when you pee. This information is not intended to replace   advice given to you by your health care provider. Make sure you discuss any questions you have with your health care provider. Document Released: 12/04/2007 Document Revised: 08/02/2015 Document Reviewed: 11/03/2014 Elsevier Interactive Patient Education  2018 [employer]. Vaginal Delivery Vaginal delivery means that you will give birth by pushing your baby out of your birth canal (vagina). A team of health care providers will help you before, during, and after vaginal delivery. Birth experiences are unique for every woman and every pregnancy, and birth experiences vary depending on where you choose to give birth. What should I do to prepare for my baby's  birth? Before your baby is born, it is important to talk with your health care provider about:  Your labor and delivery preferences. These may include: ? Medicines that you may be given. ? How you will manage your pain. This might include non-medical pain relief techniques or injectable pain relief such as epidural analgesia. ? How you and your baby will be monitored during labor and delivery. ? Who may be in the labor and delivery room with you. ? Your feelings about surgical delivery of your baby (cesarean delivery, or C-section) if this becomes necessary. ? Your feelings about receiving donated blood through an IV tube (blood transfusion) if this becomes necessary.  Whether you are able: ? To take pictures or videos of the birth. ? To eat during labor and delivery. ? To move around, walk, or change positions during labor and delivery.  What to expect after your baby is born, such as: ? Whether delayed umbilical cord clamping and cutting is offered. ? Who will care for your baby right after birth. ? Medicines or tests that may be recommended for your baby. ? Whether breastfeeding is supported in your hospital or birth center. ? How long you will be in the hospital or birth center.  How any medical conditions you have may affect your baby or your labor and delivery experience.  To prepare for your baby's birth, you should also:  Attend all of your health care visits before delivery (prenatal visits) as recommended by your health care provider. This is important.  Prepare your home for your baby's arrival. Make sure that you have: ? Diapers. ? Baby clothing. ? Feeding equipment. ? Safe sleeping arrangements for you and your baby.  Install a car seat in your vehicle. Have your car seat checked by a certified car seat installer to make sure that it is installed safely.  Think about who will help you with your new baby at home for at least the first several weeks after  delivery.  What can I expect when I arrive at the birth center or hospital? Once you are in labor and have been admitted into the hospital or birth center, your health care provider may:  Review your pregnancy history and any concerns you have.  Insert an IV tube into one of your veins. This is used to give you fluids and medicines.  Check your blood pressure, pulse, temperature, and heart rate (vital signs).  Check whether your bag of water (amniotic sac) has broken (ruptured).  Talk with you about your birth plan and discuss pain control options.  Monitoring Your health care provider may monitor your contractions (uterine monitoring) and your baby's heart rate (fetal monitoring). You may need to be monitored:  Often, but not continuously (intermittently).  All the time or for long periods at a time (continuously). Continuous monitoring may be needed if: ? You are taking  certain medicines, such as medicine to relieve pain or make your contractions stronger. ? You have pregnancy or labor complications.  Monitoring may be done by:  Placing a special stethoscope or a handheld monitoring device on your abdomen to check your baby's heartbeat, and feeling your abdomen for contractions. This method of monitoring does not continuously record your baby's heartbeat or your contractions.  Placing monitors on your abdomen (external monitors) to record your baby's heartbeat and the frequency and length of contractions. You may not have to wear external monitors all the time.  Placing monitors inside of your uterus (internal monitors) to record your baby's heartbeat and the frequency, length, and strength of your contractions. ? Your health care provider may use internal monitors if he or she needs more information about the strength of your contractions or your baby's heart rate. ? Internal monitors are put in place by passing a thin, flexible wire through your vagina and into your uterus.  Depending on the type of monitor, it may remain in your uterus or on your baby's head until birth. ? Your health care provider will discuss the benefits and risks of internal monitoring with you and will ask for your permission before inserting the monitors.  Telemetry. This is a type of continuous monitoring that can be done with external or internal monitors. Instead of having to stay in bed, you are able to move around during telemetry. Ask your health care provider if telemetry is an option for you.  Physical exam Your health care provider may perform a physical exam. This may include:  Checking whether your baby is positioned: ? With the head toward your vagina (head-down). This is most common. ? With the head toward the top of your uterus (head-up or breech). If your baby is in a breech position, your health care provider may try to turn your baby to a head-down position so you can deliver vaginally. If it does not seem that your baby can be born vaginally, your provider may recommend surgery to deliver your baby. In rare cases, you may be able to deliver vaginally if your baby is head-up (breech delivery). ? Lying sideways (transverse). Babies that are lying sideways cannot be delivered vaginally.  Checking your cervix to determine: ? Whether it is thinning out (effacing). ? Whether it is opening up (dilating). ? How low your baby has moved into your birth canal.  What are the three stages of labor and delivery?  Normal labor and delivery is divided into the following three stages: Stage 1  Stage 1 is the longest stage of labor, and it can last for hours or days. Stage 1 includes: ? Early labor. This is when contractions may be irregular, or regular and mild. Generally, early labor contractions are more than 10 minutes apart. ? Active labor. This is when contractions get longer, more regular, more frequent, and more intense. ? The transition phase. This is when contractions happen  very close together, are very intense, and may last longer than during any other part of labor.  Contractions generally feel mild, infrequent, and irregular at first. They get stronger, more frequent (about every 2-3 minutes), and more regular as you progress from early labor through active labor and transition.  Many women progress through stage 1 naturally, but you may need help to continue making progress. If this happens, your health care provider may talk with you about: ? Rupturing your amniotic sac if it has not ruptured yet. ? Giving you medicine  to help make your contractions stronger and more frequent.  Stage 1 ends when your cervix is completely dilated to 4 inches (10 cm) and completely effaced. This happens at the end of the transition phase. Stage 2  Once your cervix is completely effaced and dilated to 4 inches (10 cm), you may start to feel an urge to push. It is common for the body to naturally take a rest before feeling the urge to push, especially if you received an epidural or certain other pain medicines. This rest period may last for up to 1-2 hours, depending on your unique labor experience.  During stage 2, contractions are generally less painful, because pushing helps relieve contraction pain. Instead of contraction pain, you may feel stretching and burning pain, especially when the widest part of your baby's head passes through the vaginal opening (crowning).  Your health care provider will closely monitor your pushing progress and your baby's progress through the vagina during stage 2.  Your health care provider may massage the area of skin between your vaginal opening and anus (perineum) or apply warm compresses to your perineum. This helps it stretch as the baby's head starts to crown, which can help prevent perineal tearing. ? In some cases, an incision may be made in your perineum (episiotomy) to allow the baby to pass through the vaginal opening. An episiotomy helps  to make the opening of the vagina larger to allow more room for the baby to fit through.  It is very important to breathe and focus so your health care provider can control the delivery of your baby's head. Your health care provider may have you decrease the intensity of your pushing, to help prevent perineal tearing.  After delivery of your baby's head, the shoulders and the rest of the body generally deliver very quickly and without difficulty.  Once your baby is delivered, the umbilical cord may be cut right away, or this may be delayed for 1-2 minutes, depending on your baby's health. This may vary among health care providers, hospitals, and birth centers.  If you and your baby are healthy enough, your baby may be placed on your chest or abdomen to help maintain the baby's temperature and to help you bond with each other. Some mothers and babies start breastfeeding at this time. Your health care team will dry your baby and help keep your baby warm during this time.  Your baby may need immediate care if he or she: ? Showed signs of distress during labor. ? Has a medical condition. ? Was born too early (prematurely). ? Had a bowel movement before birth (meconium). ? Shows signs of difficulty transitioning from being inside the uterus to being outside of the uterus. If you are planning to breastfeed, your health care team will help you begin a feeding. Stage 3  The third stage of labor starts immediately after the birth of your baby and ends after you deliver the placenta. The placenta is an organ that develops during pregnancy to provide oxygen and nutrients to your baby in the womb.  Delivering the placenta may require some pushing, and you may have mild contractions. Breastfeeding can stimulate contractions to help you deliver the placenta.  After the placenta is delivered, your uterus should tighten (contract) and become firm. This helps to stop bleeding in your uterus. To help your  uterus contract and to control bleeding, your health care provider may: ? Give you medicine by injection, through an IV tube, by mouth, or  through your rectum (rectally). ? Massage your abdomen or perform a vaginal exam to remove any blood clots that are left in your uterus. ? Empty your bladder by placing a thin, flexible tube (catheter) into your bladder. ? Encourage you to breastfeed your baby. After labor is over, you and your baby will be monitored closely to ensure that you are both healthy until you are ready to go home. Your health care team will teach you how to care for yourself and your baby. This information is not intended to replace advice given to you by your health care provider. Make sure you discuss any questions you have with your health care provider. Document Released: 12/04/2007 Document Revised: 09/14/2015 Document Reviewed: 03/11/2015 Elsevier Interactive Patient Education  2018 [employer].

## 2017-04-27 NOTE — Progress Notes (Signed)
ROB- pt is having some pelvic pressure, noticed some blood on 04/23/17

## 2017-04-27 NOTE — Progress Notes (Signed)
[name] bilateral carpel tunnel pain, intermittent pelvic pressure and spotting. Just completed treatment for yeast infection. Discussed home treatment measures for common discomforts of pregnancy. Handouts given regarding carpal tunnel in pregnancy. Reviewed red flag symptoms and when to call. RTC x 1 week for ROB or sooner if needed.

## 2017-05-04 ENCOUNTER — Encounter: Payer: Self-pay | Admitting: Certified Nurse Midwife

## 2017-05-04 ENCOUNTER — Ambulatory Visit (INDEPENDENT_AMBULATORY_CARE_PROVIDER_SITE_OTHER): Payer: BLUE CROSS/BLUE SHIELD | Admitting: Certified Nurse Midwife

## 2017-05-04 VITALS — BP 104/67 | HR 81 | Wt 203.6 lb

## 2017-05-04 DIAGNOSIS — Z3483 Encounter for supervision of other normal pregnancy, third trimester: Secondary | ICD-10-CM | POA: Diagnosis not present

## 2017-05-04 LAB — POCT URINALYSIS DIPSTICK
Bilirubin, UA: NEGATIVE
Blood, UA: NEGATIVE
Glucose, UA: NEGATIVE
KETONES UA: NEGATIVE
LEUKOCYTES UA: NEGATIVE
NITRITE UA: NEGATIVE
PROTEIN UA: NEGATIVE
Spec Grav, UA: 1.025 (ref 1.010–1.025)
UROBILINOGEN UA: 0.2 U/dL
pH, UA: 6 (ref 5.0–8.0)

## 2017-05-04 NOTE — Progress Notes (Signed)
ROB, doing well. No complaints. Reviewed labor precautions. SVE per pt request 1 cm , thick, ballottable. Feels good movement. Denies contractions. Follow up 1 wk.   Doreene BurkeAnnie Fuller Makin, CNM

## 2017-05-04 NOTE — Patient Instructions (Signed)
Braxton Hicks Contractions °Contractions of the uterus can occur throughout pregnancy, but they are not always a sign that you are in labor. You may have practice contractions called Braxton Hicks contractions. These false labor contractions are sometimes confused with true labor. °What are Braxton Hicks contractions? °Braxton Hicks contractions are tightening movements that occur in the muscles of the uterus before labor. Unlike true labor contractions, these contractions do not result in opening (dilation) and thinning of the cervix. Toward the end of pregnancy (32-34 weeks), Braxton Hicks contractions can happen more often and may become stronger. These contractions are sometimes difficult to tell apart from true labor because they can be very uncomfortable. You should not feel embarrassed if you go to the hospital with false labor. °Sometimes, the only way to tell if you are in true labor is for your health care provider to look for changes in the cervix. The health care provider will do a physical exam and may monitor your contractions. If you are not in true labor, the exam should show that your cervix is not dilating and your water has not broken. °If there are other health problems associated with your pregnancy, it is completely safe for you to be sent home with false labor. You may continue to have Braxton Hicks contractions until you go into true labor. °How to tell the difference between true labor and false labor °True labor °· Contractions last 30-70 seconds. °· Contractions become very regular. °· Discomfort is usually felt in the top of the uterus, and it spreads to the lower abdomen and low back. °· Contractions do not go away with walking. °· Contractions usually become more intense and increase in frequency. °· The cervix dilates and gets thinner. °False labor °· Contractions are usually shorter and not as strong as true labor contractions. °· Contractions are usually irregular. °· Contractions  are often felt in the front of the lower abdomen and in the groin. °· Contractions may go away when you walk around or change positions while lying down. °· Contractions get weaker and are shorter-lasting as time goes on. °· The cervix usually does not dilate or become thin. °Follow these instructions at home: °· Take over-the-counter and prescription medicines only as told by your health care provider. °· Keep up with your usual exercises and follow other instructions from your health care provider. °· Eat and drink lightly if you think you are going into labor. °· If Braxton Hicks contractions are making you uncomfortable: °? Change your position from lying down or resting to walking, or change from walking to resting. °? Sit and rest in a tub of warm water. °? Drink enough fluid to keep your urine pale yellow. Dehydration may cause these contractions. °? Do slow and deep breathing several times an hour. °· Keep all follow-up prenatal visits as told by your health care provider. This is important. °Contact a health care provider if: °· You have a fever. °· You have continuous pain in your abdomen. °Get help right away if: °· Your contractions become stronger, more regular, and closer together. °· You have fluid leaking or gushing from your vagina. °· You pass blood-tinged mucus (bloody show). °· You have bleeding from your vagina. °· You have low back pain that you never had before. °· You feel your baby’s head pushing down and causing pelvic pressure. °· Your baby is not moving inside you as much as it used to. °Summary °· Contractions that occur before labor are called Braxton   Hicks contractions, false labor, or practice contractions. °· Braxton Hicks contractions are usually shorter, weaker, farther apart, and less regular than true labor contractions. True labor contractions usually become progressively stronger and regular and they become more frequent. °· Manage discomfort from Braxton Hicks contractions by  changing position, resting in a warm bath, drinking plenty of water, or practicing deep breathing. °This information is not intended to replace advice given to you by your health care provider. Make sure you discuss any questions you have with your health care provider. °Document Released: 07/10/2016 Document Revised: 07/10/2016 Document Reviewed: 07/10/2016 °Elsevier Interactive Patient Education © 2018 [employer] ° °

## 2017-05-04 NOTE — Progress Notes (Signed)
Pt is here for an ROB visit. 

## 2017-05-08 ENCOUNTER — Ambulatory Visit (INDEPENDENT_AMBULATORY_CARE_PROVIDER_SITE_OTHER): Payer: BLUE CROSS/BLUE SHIELD | Admitting: Certified Nurse Midwife

## 2017-05-08 VITALS — BP 100/71 | HR 70 | Wt 204.5 lb

## 2017-05-08 DIAGNOSIS — Z3403 Encounter for supervision of normal first pregnancy, third trimester: Secondary | ICD-10-CM

## 2017-05-08 NOTE — Progress Notes (Signed)
Pt stated that she has been feeling contractions since last night. 05/07/17

## 2017-05-08 NOTE — Progress Notes (Signed)
Labor check-Reports irregular contractions since last night. Denies vaginal bleeding or leakage of fluid. Good fetal movement. SVE unchanged since last visit. Discussed home treatment measures for discomforts of pregnancy. Reviewed red flag symptoms and when to call. RTC as previously scheduled or sooner if needed.

## 2017-05-11 ENCOUNTER — Ambulatory Visit (INDEPENDENT_AMBULATORY_CARE_PROVIDER_SITE_OTHER): Payer: BLUE CROSS/BLUE SHIELD | Admitting: Certified Nurse Midwife

## 2017-05-11 ENCOUNTER — Encounter: Payer: Self-pay | Admitting: Certified Nurse Midwife

## 2017-05-11 VITALS — BP 102/65 | HR 68 | Wt 205.2 lb

## 2017-05-11 DIAGNOSIS — Z3A38 38 weeks gestation of pregnancy: Secondary | ICD-10-CM

## 2017-05-11 NOTE — Progress Notes (Signed)
ROB, doing well . No complaints. Feels good fetal movement. Discussed induction of labor at 41 wks. ROB  One week for NST and ROB.   Doreene BurkeAnnie Aimie Wagman, CNM

## 2017-05-11 NOTE — Patient Instructions (Signed)
Nonstress Test The nonstress test is a procedure that monitors the fetus's heartbeat. The test will monitor the heartbeat when the fetus is at rest and while the fetus is moving. In a healthy fetus, there will be an increase in fetal heart rate when the fetus moves or kicks. The heart rate will decrease at rest. This test helps determine if the fetus is healthy. Your health care provider will look at a number of patterns in the heart rate tracing to make sure your baby is thriving. If there is concern, your health care provider may order additional tests or may suggest another course of action. This test is often done in the third trimester and can help determine if an early delivery is needed and safe. Common reasons to have this test are:  You are past your due date.  You have a high-risk pregnancy.  You are feeling less movement than normal.  You have lost a pregnancy in the past.  Your health care provider suspects fetal growth problems.  You have too much or too little amniotic fluid.  What happens before the procedure?  Eat a meal right before the test or as directed by your health care provider. Food may help stimulate fetal movements.  Use the restroom right before the test. What happens during the procedure?  Two belts will be placed around your abdomen. These belts have monitors attached to them. One records the fetal heart rate and the other records uterine contractions.  You may be asked to lie down on your side or to stay sitting upright.  You may be given a button to press when you feel movement.  The fetal heartbeat is listened to and watched on a screen. The heartbeat is recorded on a sheet of paper.  If the fetus seems to be sleeping, you may be asked to drink some juice or soda, gently press your abdomen, or make some noise to wake the fetus. What happens after the procedure? Your health care provider will discuss the test results with you and make recommendations  for the near future.  This information is not intended to replace advice given to you by your health care provider. Make sure you discuss any questions you have with your health care provider. This information is not intended to replace advice given to you by your health care provider. Make sure you discuss any questions you have with your health care provider. Document Released: 02/14/2002 Document Revised: 01/25/2016 Document Reviewed: 03/30/2012 Elsevier Interactive Patient Education  2018 [employer] Inc.  

## 2017-05-20 ENCOUNTER — Ambulatory Visit (INDEPENDENT_AMBULATORY_CARE_PROVIDER_SITE_OTHER): Payer: BLUE CROSS/BLUE SHIELD | Admitting: Certified Nurse Midwife

## 2017-05-20 ENCOUNTER — Other Ambulatory Visit: Payer: BLUE CROSS/BLUE SHIELD

## 2017-05-20 VITALS — BP 139/74 | HR 82 | Wt 205.2 lb

## 2017-05-20 DIAGNOSIS — Z3493 Encounter for supervision of normal pregnancy, unspecified, third trimester: Secondary | ICD-10-CM

## 2017-05-20 NOTE — Progress Notes (Signed)
ROB and NST today for postdates. NST reactive, catergory 1 strip. Baseline 140 with accels present, moderate variability, no decels. Occasional contractions. Discussed induction with pt. SVE 1/thick/ ballotable. Induction request form faxed to L&D will call with date. Pt to return on Monday for BPP and ROB.   Doreene BurkeAnnie Donelle Baba, CNM

## 2017-05-20 NOTE — Progress Notes (Signed)
ROB&NST- pt is having some pelvic pressure 

## 2017-05-20 NOTE — Patient Instructions (Signed)
 Labor Induction Labor induction is when steps are taken to cause a pregnant woman to begin the labor process. Most women go into labor on their own between 37 weeks and 42 weeks of the pregnancy. When this does not happen or when there is a medical need, methods may be used to induce labor. Labor induction causes a pregnant woman's uterus to contract. It also causes the cervix to soften (ripen), open (dilate), and thin out (efface). Usually, labor is not induced before 39 weeks of the pregnancy unless there is a problem with the baby or mother. Before inducing labor, your health care provider will consider a number of factors, including the following:  The medical condition of you and the baby.  How many weeks along you are.  The status of the baby's lung maturity.  The condition of the cervix.  The position of the baby.  What are the reasons for labor induction? Labor may be induced for the following reasons:  The health of the baby or mother is at risk.  The pregnancy is overdue by 1 week or more.  The water breaks but labor does not start on its own.  The mother has a health condition or serious illness, such as high blood pressure, infection, placental abruption, or diabetes.  The amniotic fluid amounts are low around the baby.  The baby is distressed.  Convenience or wanting the baby to be born on a certain date is not a reason for inducing labor. What methods are used for labor induction? Several methods of labor induction may be used, such as:  Prostaglandin medicine. This medicine causes the cervix to dilate and ripen. The medicine will also start contractions. It can be taken by mouth or by inserting a suppository into the vagina.  Inserting a thin tube (catheter) with a balloon on the end into the vagina to dilate the cervix. Once inserted, the balloon is expanded with water, which causes the cervix to open.  Stripping the membranes. Your health care provider  separates amniotic sac tissue from the cervix, causing the cervix to be stretched and causing the release of a hormone called progesterone. This may cause the uterus to contract. It is often done during an office visit. You will be sent home to wait for the contractions to begin. You will then come in for an induction.  Breaking the water. Your health care provider makes a hole in the amniotic sac using a small instrument. Once the amniotic sac breaks, contractions should begin. This may still take hours to see an effect.  Medicine to trigger or strengthen contractions. This medicine is given through an IV access tube inserted into a vein in your arm.  All of the methods of induction, besides stripping the membranes, will be done in the hospital. Induction is done in the hospital so that you and the baby can be carefully monitored. How long does it take for labor to be induced? Some inductions can take up to 2-3 days. Depending on the cervix, it usually takes less time. It takes longer when you are induced early in the pregnancy or if this is your first pregnancy. If a mother is still pregnant and the induction has been going on for 2-3 days, either the mother will be sent home or a cesarean delivery will be needed. What are the risks associated with labor induction? Some of the risks of induction include:  Changes in fetal heart rate, such as too high, too low, or erratic.    Fetal distress.  Chance of infection for the mother and baby.  Increased chance of having a cesarean delivery.  Breaking off (abruption) of the placenta from the uterus (rare).  Uterine rupture (very rare).  When induction is needed for medical reasons, the benefits of induction may outweigh the risks. What are some reasons for not inducing labor? Labor induction should not be done if:  It is shown that your baby does not tolerate labor.  You have had previous surgeries on your uterus, such as a myomectomy or the  removal of fibroids.  Your placenta lies very low in the uterus and blocks the opening of the cervix (placenta previa).  Your baby is not in a head-down position.  The umbilical cord drops down into the birth canal in front of the baby. This could cut off the baby's blood and oxygen supply.  You have had a previous cesarean delivery.  There are unusual circumstances, such as the baby being extremely premature.  This information is not intended to replace advice given to you by your health care provider. Make sure you discuss any questions you have with your health care provider. Document Released: 07/16/2006 Document Revised: 08/02/2015 Document Reviewed: 09/23/2012 Elsevier Interactive Patient Education  2017 [employer] Inc[employer]  Braxton Hicks Contractions Contractions of the uterus can occur throughout pregnancy, but they are not always a sign that you are in labor. You may have practice contractions called Braxton Hicks contractions. These false labor contractions are sometimes confused with true labor. What are Braxton Hicks contractions? Braxton Hicks contractions are tightening movements that occur in the muscles of the uterus before labor. Unlike true labor contractions, these contractions do not result in opening (dilation) and thinning of the cervix. Toward the end of pregnancy (32-34 weeks), Braxton Hicks contractions can happen more often and may become stronger. These contractions are sometimes difficult to tell apart from true labor because they can be very uncomfortable. You should not feel embarrassed if you go to the hospital with false labor. Sometimes, the only way to tell if you are in true labor is for your health care provider to look for changes in the cervix. The health care provider will do a physical exam and may monitor your contractions. If you are not in true labor, the exam should show that your cervix is not dilating and your water has not broken. If there are other  health problems associated with your pregnancy, it is completely safe for you to be sent home with false labor. You may continue to have Braxton Hicks contractions until you go into true labor. How to tell the difference between true labor and false labor True labor  Contractions last 30-70 seconds.  Contractions become very regular.  Discomfort is usually felt in the top of the uterus, and it spreads to the lower abdomen and low back.  Contractions do not go away with walking.  Contractions usually become more intense and increase in frequency.  The cervix dilates and gets thinner. False labor  Contractions are usually shorter and not as strong as true labor contractions.  Contractions are usually irregular.  Contractions are often felt in the front of the lower abdomen and in the groin.  Contractions may go away when you walk around or change positions while lying down.  Contractions get weaker and are shorter-lasting as time goes on.  The cervix usually does not dilate or become thin. Follow these instructions at home:  Take over-the-counter and prescription medicines only as told   by your health care provider.  Keep up with your usual exercises and follow other instructions from your health care provider.  Eat and drink lightly if you think you are going into labor.  If Braxton Hicks contractions are making you uncomfortable: ? Change your position from lying down or resting to walking, or change from walking to resting. ? Sit and rest in a tub of warm water. ? Drink enough fluid to keep your urine pale yellow. Dehydration may cause these contractions. ? Do slow and deep breathing several times an hour.  Keep all follow-up prenatal visits as told by your health care provider. This is important. Contact a health care provider if:  You have a fever.  You have continuous pain in your abdomen. Get help right away if:  Your contractions become stronger, more regular,  and closer together.  You have fluid leaking or gushing from your vagina.  You pass blood-tinged mucus (bloody show).  You have bleeding from your vagina.  You have low back pain that you never had before.  You feel your baby's head pushing down and causing pelvic pressure.  Your baby is not moving inside you as much as it used to. Summary  Contractions that occur before labor are called Braxton Hicks contractions, false labor, or practice contractions.  Braxton Hicks contractions are usually shorter, weaker, farther apart, and less regular than true labor contractions. True labor contractions usually become progressively stronger and regular and they become more frequent.  Manage discomfort from Braxton Hicks contractions by changing position, resting in a warm bath, drinking plenty of water, or practicing deep breathing. This information is not intended to replace advice given to you by your health care provider. Make sure you discuss any questions you have with your health care provider. Document Released: 07/10/2016 Document Revised: 07/10/2016 Document Reviewed: 07/10/2016 Elsevier Interactive Patient Education  2018 [employer] Inc.  

## 2017-05-21 ENCOUNTER — Other Ambulatory Visit: Payer: Self-pay | Admitting: Certified Nurse Midwife

## 2017-05-21 DIAGNOSIS — O480 Post-term pregnancy: Secondary | ICD-10-CM

## 2017-05-25 ENCOUNTER — Other Ambulatory Visit: Payer: Self-pay | Admitting: Certified Nurse Midwife

## 2017-05-25 ENCOUNTER — Ambulatory Visit (INDEPENDENT_AMBULATORY_CARE_PROVIDER_SITE_OTHER): Payer: BLUE CROSS/BLUE SHIELD | Admitting: Certified Nurse Midwife

## 2017-05-25 ENCOUNTER — Ambulatory Visit (INDEPENDENT_AMBULATORY_CARE_PROVIDER_SITE_OTHER): Payer: BLUE CROSS/BLUE SHIELD

## 2017-05-25 VITALS — BP 118/77 | HR 74 | Wt 207.1 lb

## 2017-05-25 DIAGNOSIS — O480 Post-term pregnancy: Secondary | ICD-10-CM

## 2017-05-25 DIAGNOSIS — Z3493 Encounter for supervision of normal pregnancy, unspecified, third trimester: Secondary | ICD-10-CM

## 2017-05-25 LAB — POCT URINALYSIS DIPSTICK
BILIRUBIN UA: NEGATIVE
Bilirubin, UA: NEGATIVE
Blood, UA: NEGATIVE
GLUCOSE UA: NEGATIVE
Glucose, UA: NEGATIVE
KETONES UA: NEGATIVE
Ketones, UA: NEGATIVE
LEUKOCYTES UA: NEGATIVE
Leukocytes, UA: NEGATIVE
NITRITE UA: NEGATIVE
Nitrite, UA: NEGATIVE
PROTEIN UA: 1
Protein, UA: NEGATIVE
SPEC GRAV UA: 1.01 (ref 1.010–1.025)
Spec Grav, UA: 1.015 (ref 1.010–1.025)
Urobilinogen, UA: 0.2 E.U./dL
Urobilinogen, UA: 0.2 E.U./dL
pH, UA: 6 (ref 5.0–8.0)
pH, UA: 6 (ref 5.0–8.0)

## 2017-05-25 NOTE — Progress Notes (Signed)
Pt is here for an ROB visit and an NST. 

## 2017-05-25 NOTE — Patient Instructions (Signed)
Labor Induction Labor induction is when steps are taken to cause a pregnant woman to begin the labor process. Most women go into labor on their own between 37 weeks and 42 weeks of the pregnancy. When this does not happen or when there is a medical need, methods may be used to induce labor. Labor induction causes a pregnant woman's uterus to contract. It also causes the cervix to soften (ripen), open (dilate), and thin out (efface). Usually, labor is not induced before 39 weeks of the pregnancy unless there is a problem with the baby or mother. Before inducing labor, your health care provider will consider a number of factors, including the following:  The medical condition of you and the baby.  How many weeks along you are.  The status of the baby's lung maturity.  The condition of the cervix.  The position of the baby. What are the reasons for labor induction? Labor may be induced for the following reasons:  The health of the baby or mother is at risk.  The pregnancy is overdue by 1 week or more.  The water breaks but labor does not start on its own.  The mother has a health condition or serious illness, such as high blood pressure, infection, placental abruption, or diabetes.  The amniotic fluid amounts are low around the baby.  The baby is distressed. Convenience or wanting the baby to be born on a certain date is not a reason for inducing labor. What methods are used for labor induction? Several methods of labor induction may be used, such as:  Prostaglandin medicine. This medicine causes the cervix to dilate and ripen. The medicine will also start contractions. It can be taken by mouth or by inserting a suppository into the vagina.  Inserting a thin tube (catheter) with a balloon on the end into the vagina to dilate the cervix. Once inserted, the balloon is expanded with water, which causes the cervix to open.  Stripping the membranes. Your health care provider separates  amniotic sac tissue from the cervix, causing the cervix to be stretched and causing the release of a hormone called progesterone. This may cause the uterus to contract. It is often done during an office visit. You will be sent home to wait for the contractions to begin. You will then come in for an induction.  Breaking the water. Your health care provider makes a hole in the amniotic sac using a small instrument. Once the amniotic sac breaks, contractions should begin. This may still take hours to see an effect.  Medicine to trigger or strengthen contractions. This medicine is given through an IV access tube inserted into a vein in your arm. All of the methods of induction, besides stripping the membranes, will be done in the hospital. Induction is done in the hospital so that you and the baby can be carefully monitored. How long does it take for labor to be induced? Some inductions can take up to 2-3 days. Depending on the cervix, it usually takes less time. It takes longer when you are induced early in the pregnancy or if this is your first pregnancy. If a mother is still pregnant and the induction has been going on for 2-3 days, either the mother will be sent home or a cesarean delivery will be needed. What are the risks associated with labor induction? Some of the risks of induction include:  Changes in fetal heart rate, such as too high, too low, or erratic.  Fetal distress.    Chance of infection for the mother and baby.  Increased chance of having a cesarean delivery.  Breaking off (abruption) of the placenta from the uterus (rare).  Uterine rupture (very rare). When induction is needed for medical reasons, the benefits of induction may outweigh the risks. What are some reasons for not inducing labor? Labor induction should not be done if:  It is shown that your baby does not tolerate labor.  You have had previous surgeries on your uterus, such as a myomectomy or the removal of  fibroids.  Your placenta lies very low in the uterus and blocks the opening of the cervix (placenta previa).  Your baby is not in a head-down position.  The umbilical cord drops down into the birth canal in front of the baby. This could cut off the baby's blood and oxygen supply.  You have had a previous cesarean delivery.  There are unusual circumstances, such as the baby being extremely premature. This information is not intended to replace advice given to you by your health care provider. Make sure you discuss any questions you have with your health care provider. Document Released: 07/16/2006 Document Revised: 08/02/2015 Document Reviewed: 09/23/2012 Elsevier Interactive Patient Education  2017 [employer] Inc.  

## 2017-05-25 NOTE — Progress Notes (Signed)
ROB, doing well. No complaints. BPP today for post dates 8/8 see below for full report. Discussed induction on Wednesday @ 0500. SVE 1/70/-2 She verbalizes and agrees to plan. Follow up as scheduled for induction.   Doreene BurkeAnnie Debbora Ang, CNM   ULTRASOUND REPORT  Location: [facility] Date of Service:  05/25/2017  Indications: BPP & Growth for Post-Dates Findings:  Singleton intrauterine pregnancy is visualized with FHR at 143 BPM. Biometrics give an (U/S) Gestational age of 23 6/7 weeks and an (U/S) EDD of 06/02/17; this correlates with the clinically established EDD of 05/20/17.  Fetal presentation is vertex.  EFW: 3781 grams (8lb 5oz).  66th percentile. Placenta: Fundal and grade 3. AFI: 9.8 cm.  BPP: 8/8 with good visualization of fetal movement, tone, breathing, and fluid.  Fetal stomach, kidneys, and bladder appear WNL.  Impression: 1. 38 6/7 week Viable Singleton Intrauterine pregnancy by U/S. 2. (U/S) EDD is consistent with Clinically established (LMP) EDD of 05/20/17. 3. EFW: 3781 grams (8lb 5oz).  66th percentile. 4. BPP: 8/8  Recommendations: 1.Clinical correlation with the patient's History and Physical Exam.

## 2017-05-26 ENCOUNTER — Other Ambulatory Visit: Payer: Self-pay

## 2017-05-26 ENCOUNTER — Observation Stay
Admission: EM | Admit: 2017-05-26 | Discharge: 2017-05-26 | Disposition: A | Discharge status: Home / Self Care | Payer: BLUE CROSS/BLUE SHIELD | Source: Home / Self Care | Admitting: Obstetrics and Gynecology

## 2017-05-26 ENCOUNTER — Other Ambulatory Visit: Payer: Self-pay | Admitting: Certified Nurse Midwife

## 2017-05-26 DIAGNOSIS — O368130 Decreased fetal movements, third trimester, not applicable or unspecified: Secondary | ICD-10-CM | POA: Diagnosis not present

## 2017-05-26 DIAGNOSIS — O480 Post-term pregnancy: Secondary | ICD-10-CM | POA: Insufficient documentation

## 2017-05-26 DIAGNOSIS — Z3A40 40 weeks gestation of pregnancy: Secondary | ICD-10-CM

## 2017-05-26 NOTE — OB Triage Note (Signed)
Patient arrived on unit via wheelchair with complaints of abdominal pain (contractions), decreased fetal movement, and vaginal bleeding only when she wipes with toilet paper.  Patient states her baby is now moving.  Patient called the office, who told her to come to labor and delivery.

## 2017-05-27 ENCOUNTER — Inpatient Hospital Stay
Admission: EM | Admit: 2017-05-27 | Discharge: 2017-05-30 | DRG: 788 | Disposition: A | Discharge status: Home / Self Care | Payer: BLUE CROSS/BLUE SHIELD | Attending: Certified Nurse Midwife | Admitting: Certified Nurse Midwife

## 2017-05-27 DIAGNOSIS — Z3490 Encounter for supervision of normal pregnancy, unspecified, unspecified trimester: Secondary | ICD-10-CM | POA: Diagnosis present

## 2017-05-27 DIAGNOSIS — O99214 Obesity complicating childbirth: Secondary | ICD-10-CM | POA: Diagnosis present

## 2017-05-27 DIAGNOSIS — O480 Post-term pregnancy: Secondary | ICD-10-CM | POA: Diagnosis present

## 2017-05-27 DIAGNOSIS — E669 Obesity, unspecified: Secondary | ICD-10-CM | POA: Diagnosis present

## 2017-05-27 DIAGNOSIS — O9902 Anemia complicating childbirth: Secondary | ICD-10-CM | POA: Diagnosis present

## 2017-05-27 DIAGNOSIS — D649 Anemia, unspecified: Secondary | ICD-10-CM | POA: Diagnosis present

## 2017-05-27 DIAGNOSIS — Z3A41 41 weeks gestation of pregnancy: Secondary | ICD-10-CM

## 2017-05-27 LAB — CBC
HCT: 37 % (ref 35.0–47.0)
Hemoglobin: 12.1 g/dL (ref 12.0–16.0)
MCH: 26.5 pg (ref 26.0–34.0)
MCHC: 32.8 g/dL (ref 32.0–36.0)
MCV: 80.8 fL (ref 80.0–100.0)
PLATELETS: 233 10*3/uL (ref 150–440)
RBC: 4.58 MIL/uL (ref 3.80–5.20)
RDW: 16.1 % — AB (ref 11.5–14.5)
WBC: 13.9 10*3/uL — ABNORMAL HIGH (ref 3.6–11.0)

## 2017-05-27 LAB — TYPE AND SCREEN
ABO/RH(D): A POS
Antibody Screen: NEGATIVE

## 2017-05-27 MED ORDER — SOD CITRATE-CITRIC ACID 500-334 MG/5ML PO SOLN
30.0000 mL | ORAL | Status: DC | PRN
  Administered 2017-05-28: 30 mL via ORAL

## 2017-05-27 MED ORDER — MISOPROSTOL 50MCG HALF TABLET
50.0000 ug | ORAL_TABLET | ORAL | Status: DC
Start: 2017-05-27 — End: 2017-05-27
  Administered 2017-05-27: 50 ug via VAGINAL
  Filled 2017-05-27 (×2): qty 1

## 2017-05-27 MED ORDER — AMMONIA AROMATIC IN INHA
RESPIRATORY_TRACT | Status: AC
  Filled 2017-05-27: qty 10

## 2017-05-27 MED ORDER — FLEET ENEMA 7-19 GM/118ML RE ENEM: 1.0000 | ENEMA | Freq: Once | RECTAL | Status: DC

## 2017-05-27 MED ORDER — LACTATED RINGERS IV SOLN
INTRAVENOUS | Status: DC
  Administered 2017-05-27: 22:00:00 via INTRAUTERINE

## 2017-05-27 MED ORDER — MISOPROSTOL 200 MCG PO TABS
ORAL_TABLET | ORAL | Status: AC
  Filled 2017-05-27: qty 4

## 2017-05-27 MED ORDER — MISOPROSTOL 50MCG HALF TABLET
50.0000 ug | ORAL_TABLET | ORAL | Status: DC
  Administered 2017-05-27: 50 ug via ORAL

## 2017-05-27 MED ORDER — OXYCODONE-ACETAMINOPHEN 5-325 MG PO TABS: 1.0000 | ORAL_TABLET | ORAL | Status: DC | PRN

## 2017-05-27 MED ORDER — OXYTOCIN BOLUS FROM INFUSION: 500.0000 mL | Freq: Once | INTRAVENOUS | Status: DC

## 2017-05-27 MED ORDER — LIDOCAINE HCL (PF) 1 % IJ SOLN: 30.0000 mL | INTRAMUSCULAR | Status: DC | PRN

## 2017-05-27 MED ORDER — SODIUM CHLORIDE FLUSH 0.9 % IV SOLN
INTRAVENOUS | Status: AC
  Filled 2017-05-27: qty 10

## 2017-05-27 MED ORDER — OXYTOCIN 40 UNITS IN LACTATED RINGERS INFUSION - SIMPLE MED
1.0000 m[IU]/min | INTRAVENOUS | Status: DC
  Filled 2017-05-27: qty 1000

## 2017-05-27 MED ORDER — OXYTOCIN 10 UNIT/ML IJ SOLN
INTRAMUSCULAR | Status: AC
  Filled 2017-05-27: qty 2

## 2017-05-27 MED ORDER — BUTORPHANOL TARTRATE 1 MG/ML IJ SOLN
1.0000 mg | INTRAMUSCULAR | Status: DC | PRN
  Filled 2017-05-27 (×2): qty 1

## 2017-05-27 MED ORDER — OXYCODONE-ACETAMINOPHEN 5-325 MG PO TABS: 2.0000 | ORAL_TABLET | ORAL | Status: DC | PRN

## 2017-05-27 MED ORDER — TERBUTALINE SULFATE 1 MG/ML IJ SOLN: 0.2500 mg | Freq: Once | INTRAMUSCULAR | Status: DC | PRN

## 2017-05-27 MED ORDER — LACTATED RINGERS IV SOLN
INTRAVENOUS | Status: DC
  Administered 2017-05-27: 20:00:00 via INTRAVENOUS
  Administered 2017-05-27: 1000 mL via INTRAVENOUS
  Administered 2017-05-27 – 2017-05-28 (×2): via INTRAVENOUS

## 2017-05-27 MED ORDER — OXYTOCIN 40 UNITS IN LACTATED RINGERS INFUSION - SIMPLE MED: 2.5000 [IU]/h | INTRAVENOUS | Status: DC

## 2017-05-27 MED ORDER — ACETAMINOPHEN 325 MG PO TABS: 650.0000 mg | ORAL_TABLET | ORAL | Status: DC | PRN

## 2017-05-27 MED ORDER — LACTATED RINGERS IV SOLN
500.0000 mL | INTRAVENOUS | Status: DC | PRN
  Administered 2017-05-27: 500 mL via INTRAVENOUS

## 2017-05-27 MED ORDER — LIDOCAINE HCL (PF) 1 % IJ SOLN
INTRAMUSCULAR | Status: AC
  Filled 2017-05-27: qty 30

## 2017-05-27 MED ORDER — ZOLPIDEM TARTRATE 5 MG PO TABS: 5.0000 mg | ORAL_TABLET | Freq: Every evening | ORAL | Status: DC | PRN

## 2017-05-27 MED ORDER — ONDANSETRON HCL 4 MG/2ML IJ SOLN: 4.0000 mg | Freq: Four times a day (QID) | INTRAMUSCULAR | Status: DC | PRN

## 2017-05-27 MED ORDER — BUTORPHANOL TARTRATE 1 MG/ML IJ SOLN
2.0000 mg | INTRAMUSCULAR | Status: DC | PRN
  Administered 2017-05-27: 2 mg via INTRAVENOUS

## 2017-05-27 NOTE — Progress Notes (Addendum)
Patient ID: Carly Henry, female   DOB: 05/09/1995, 22 y.o.   MRN: [id number]  Carly Henry is a 22 y.o. G1P0 at 3748w0d by ultrasound admitted for induction of labor due to Post dates. Due date 05/20/2017.  Subjective:  Reports limited relief with use of nitrous oxide. Family members and doula present at bedside of continuous labor support.   Denies difficulty breathing or respiratory distress, chest pain, dysuria, and leg pain or swelling.    Objective:  Temp:  [97.7 F (36.5 C)-98.8 F (37.1 C)] 97.7 F (36.5 C) (03/20 2140) Pulse Rate:  [79-99] 85 (03/20 2157) Resp:  [16-18] 18 (03/20 2157) BP: (116-131)/(67-90) 125/82 (03/20 2157) SpO2:  [100 %] 100 % (03/20 2157) Weight:  [204 lb (92.5 kg)] 204 lb (92.5 kg) (03/20 0514)  Fetal Wellbeing:  Category II, FSE replaced without difficulty  UC:   regular, every three (3) to five (5) minutes, soft resting tone  SVE:   Dilation: 8 Effacement (%): 100 Station: 0 Exam by:: Alva Broxson, CNM  IUPC placed without difficulty  Labs: Lab Results  Component Value Date   WBC 13.9 (H) 05/27/2017   HGB 12.1 05/27/2017   HCT 37.0 05/27/2017   MCV 80.8 05/27/2017   PLT 233 05/27/2017    Assessment:  Carly Reyes Zavaletais a 21 y.o.G1P0 at 3948w0d admitted for induction of labor due to postdates, Rh positive, GBS negative  FHR Category II  Plan:  FSE replaced and IUPC placed without difficulty. Will start amnioinfusion, see orders.   Encouraged relaxation techniques, rest and position changes.   Reviewed red flag symptoms and when to call.   Continue orders as written. Reassess as needed.  Dr. Valentino Saxonherry notified.    Gunnar BullaJenkins Michelle Lannis Lichtenwalner, CNM [facility], [facility] 05/27/2017, 10:28 PM

## 2017-05-27 NOTE — Discharge Summary (Signed)
Obstetric Discharge Summary  Patient ID: Carly Henry MRN: [id number] DOB/AGE: 23/04/1995 23 y.o.   Date of Admission: 05/26/2017 Carly Henry, CNM [facility](D. Evans, MD)  Date of Discharge: 05/26/2017 Carly RoyalsMichelle Buena Henry, CNM [facility](D. Evans, MD)  Admitting Diagnosis: Observation at 7467w6d  Secondary Diagnosis: None     Discharge Diagnosis: No other diagnosis   Antepartum Procedures: NST   Brief Hospital Course   L&D OB Triage Note  Carly Henry is a 22 y.o. G1P0 female at 267w6d, EDD Estimated Date of Delivery: 05/20/17 who presented to triage for complaints of intermittent lower abdominal and back pain, vaginal bleeding, and decreased fetal movement.  She was evaluated by the nurses with no significant findings for labor or fetal distress. Vital signs stable. An NST was performed and has been reviewed by CNM.  NST INTERPRETATION: Indications: decreased fetal movement, post-dates pregnancy and rule out uterine contractions  Mode: External Baseline Rate (A): 145 bpm Variability: Moderate Accelerations: 15 x 15 Decelerations: None Contraction Frequency (min): Occasional  Dilation: 1 Effacement (%): 30 Cervical Position: Posterior Station: Ballotable Exam by:: J.Wagoner, RN   Plan: NST performed was reviewed and was found to be reactive. She was discharged home with bleeding/labor precautions. Follow up for scheduled induction of labor tomorrow or sooner if needed.  Discharge Instructions: Per After Visit Summary.  Activity: Rrfer to After Visit Summary  Diet: Regular  Medications:  Allergies as of 05/26/2017   No Known Allergies     Medication List    ASK your doctor about these medications   Prenatal Vitamins 28-0.8 MG Tabs Take by mouth.      Outpatient follow up:   Discharged Condition: stable  Discharged to: home   Carly Henry, CNM [facility], [facility]

## 2017-05-27 NOTE — Progress Notes (Signed)
Patient ID: Shanda BumpsJasmine Reyes Zavaleta, female   DOB: 08/03/1995, 22 y.o.   MRN: [id number]  Shanda BumpsJasmine Reyes Zavaleta is a 22 y.o. G1P0 at 4027w0d by ultrasound admitted for induction of labor due to Post dates. Due date 05/20/2017.  Subjective:  Lying in bed on right side, breathing through contractions. Family member and doula at bedside for continuous labor support.   Denies difficulty breathing or respiratory distress, chest pain, vaginal bleeding, dysuria, and leg pain or swelling.   Objective:  Temp:  [98.3 F (36.8 C)-98.8 F (37.1 C)] 98.8 F (37.1 C) (03/20 1711) Pulse Rate:  [79-99] 98 (03/20 1719) Resp:  [16-18] 18 (03/20 1711) BP: (116-131)/(67-90) 116/90 (03/20 1719) Weight:  [204 lb (92.5 kg)] 204 lb (92.5 kg) (03/20 0514)  Fetal Wellbeing:  Category II  UC:   regular, every four (4) to six (6) minutes, soft resting tone  SVE:   Dilation: 6.5 Effacement (%): 100 Station: 0 Exam by:: Bodey Frizell  Labs: Lab Results  Component Value Date   WBC 13.9 (H) 05/27/2017   HGB 12.1 05/27/2017   HCT 37.0 05/27/2017   MCV 80.8 05/27/2017   PLT 233 05/27/2017    Assessment:  Jayliana Reyes Zavaletais a 21 y.o.G1P0 at 6827w0d admitted for induction of labor due to postdates, Rh positive, GBS negative  FHR Category II  Plan:  IVF bolus now.  Encouraged rest and position change.   Reviewed red flag symptoms and when to call.   Continue orders as written. Reassess as needed.    Gunnar BullaJenkins Michelle Juneau Doughman, CNM [facility], [facility] 05/27/2017, 7:04 PM

## 2017-05-27 NOTE — Progress Notes (Signed)
Patient ID: Shanda BumpsJasmine Reyes Zavaleta, female   DOB: 01/01/1996, 22 y.o.   MRN: [id number]  Shanda BumpsJasmine Reyes Zavaleta is a 22 y.o. G1P0 at 2238w0d by ultrasound admitted for induction of labor due to Post dates. Due date 05/20/2017.  Subjective:  Sitting in rocking chair, breathing through contractions. Family members and doula present at bedside for continuous labor support. Desires AROM to speed induction process.   Denies difficulty breathing or respiratory distress, chest pain, vaginal bleeding, dysuria, and leg pain or swelling.   Objective:  Temp:  [98.3 F (36.8 C)-98.8 F (37.1 C)] 98.8 F (37.1 C) (03/20 1711) Pulse Rate:  [79-99] 98 (03/20 1719) Resp:  [16-18] 18 (03/20 1711) BP: (116-131)/(67-90) 116/90 (03/20 1719) Weight:  [204 lb (92.5 kg)] 204 lb (92.5 kg) (03/20 0514)  Fetal Wellbeing:  Category I  UC:   regular, every three (3) to six (6) minutes, soft resting tone  SVE:   Dilation: 5 Effacement (%): 100 Station: 0 Exam by:: Laurel Smeltz'  AROM clear fluid, moderate amount  Labs: Lab Results  Component Value Date   WBC 13.9 (H) 05/27/2017   HGB 12.1 05/27/2017   HCT 37.0 05/27/2017   MCV 80.8 05/27/2017   PLT 233 05/27/2017    Assessment:  Almena Reyes Zavaletais a 21 y.o.G1P0 at 3438w0d admitted for induction of labor due to postdates, Rh positive, GBS negative  FHR Category I  Plan:  Discussed options for induction/augmentation including nonpharmacologic methods, piotcin and AROM. Patient still desires AROM.   AROM performed without difficulty.   Encouraged rest and position change. Continue orders as written. Reassess as needed.    Gunnar BullaJenkins Michelle Aldea Avis, CNM [facility], [facility] 05/27/2017, 5:20 PM

## 2017-05-27 NOTE — H&P (Signed)
Obstetric History and Physical  Jazell Loletha GrayerReyes Zavaleta is a 22 y.o. G1P0 with IUP at 3743w0d, dated by first trimester US, presenting for induction of labor due to postdates pregnancy. Patient states she has been having  Irregular contractions, none vaginal bleeding, intact membranes, with active fetal movement.    Denies difficulty breathing or respiratory distress, chest pain, dysuria, and leg pain or swelling.   Prenatal Course  Source of Care: [facility]-initial visit at 10 wks, total visits: 15.   Pregnancy complications or risks: Obesity in pregnancy  Prenatal labs and studies:  ABO, Rh: --/--/A POS (03/20 0555)  Antibody: NEG (03/20 0555)   Rubella: 2.76 (08/21 1617)   Varicella: 446 (08/21 1617)  RPR: Non Reactive (12/14 1547)   HBsAg: Negative (08/21 1617)   HIV: Non Reactive (08/21 1617)   GBS: Negative (02/11 1151)  1 hr Glucola: 126 (12/14 1547)  Genetic screening: Declines  Anatomy US: Normal (10/24 1539)  Past Medical History:  Diagnosis Date  . Migraine     Past Surgical History:  Procedure Laterality Date  . no surgerical history      OB History  Gravida Para Term Preterm AB Living  1            SAB TAB Ectopic Multiple Live Births               # Outcome Date GA Lbr Len/2nd Weight Sex Delivery Anes PTL Lv  1 Current               Social History   Socioeconomic History  . Marital status: Married    Spouse name: None  . Number of children: 0  . Years of education: 12+  . Highest education level: None  Social Needs  . Financial resource strain: None  . Food insecurity - worry: None  . Food insecurity - inability: None  . Transportation needs - medical: None  . Transportation needs - non-medical: None  Occupational History  . Occupation: ArchivistCollege student  Tobacco Use  . Smoking status: Never Smoker  . Smokeless tobacco: Never Used  Substance and Sexual Activity  . Alcohol use: No    Frequency: Never    Comment: Rare  . Drug use: No   . Sexual activity: Yes    Birth control/protection: None  Other Topics Concern  . None  Social History Narrative  . None    Family History  Problem Relation Age of Onset  . Gestational diabetes Mother   . Migraines Mother   . High Cholesterol Mother   . High Cholesterol Father   . Diabetes Maternal Grandmother   . Diabetes Maternal Grandfather     Medications Prior to Admission  Medication Sig Dispense Refill Last Dose  . Prenatal Vit-Fe Fumarate-FA (PRENATAL VITAMINS) 28-0.8 MG TABS Take by mouth.   05/26/2017 at Unknown time    No Known Allergies  Review of Systems: Negative except for what is mentioned in HPI.  Physical Exam:  Temp:  [98.3 F (36.8 C)-98.6 F (37 C)] 98.6 F (37 C) (03/20 1040) Pulse Rate:  [90-99] 90 (03/20 1040) Resp:  [16-18] 16 (03/20 1040) BP: (125-131)/(67-80) 125/67 (03/20 1040) Weight:  [204 lb (92.5 kg)] 204 lb (92.5 kg) (03/20 0514)  GENERAL: Well-developed, well-nourished female in no acute distress.   LUNGS: Clear to auscultation bilaterally.   HEART: Regular rate and rhythm.  ABDOMEN: Soft, nontender, nondistended, gravid.  EXTREMITIES: Nontender, no edema, 2+ distal pulses.  Cervical Exam: Dilation: 2 Effacement (%):  80 Cervical Position: Middle Station: -1 Presentation: Vertex Exam by:: gck  FHT:  Baseline rate 135 bpm   Variability moderate  Accelerations present   Decelerations none  Contractions: Every four (4) to six (6) minutes, soft resting tone   Pertinent Labs/Studies:    Results for orders placed or performed during the hospital encounter of 05/27/17 (from the past 24 hour(s))  CBC     Status: Abnormal   Collection Time: 05/27/17  5:55 AM  Result Value Ref Range   WBC 13.9 (H) 3.6 - 11.0 K/uL   RBC 4.58 3.80 - 5.20 MIL/uL   Hemoglobin 12.1 12.0 - 16.0 g/dL   HCT 16.1 09.6 - 04.5 %   MCV 80.8 80.0 - 100.0 fL   MCH 26.5 26.0 - 34.0 pg   MCHC 32.8 32.0 - 36.0 g/dL   RDW 40.9 (H) 81.1 - 91.4 %    Platelets 233 150 - 440 K/uL  Type and screen     Status: None   Collection Time: 05/27/17  5:55 AM  Result Value Ref Range   ABO/RH(D) A POS    Antibody Screen NEG    Sample Expiration      05/30/2017 Performed at [facility], [address], [place], [place] [address]     Assessment :  Jameson Morrow is a 22 y.o. G1P0 at [redacted]w[redacted]d being admitted for induction of labor due to postdates, Rh positive, GBS negative  FHR Category I  Plan:  Admit to birthing suites, see orders.   Induction/Augmentation, per protocol.   Options for induction reviewed with patient including cytotec, pitocin, and foley bulb. Patient would like to continue with cytotec at this time. Asks to be reassess at 1300 for possible foley bulb placement.   Reviewed red flag symptoms and when to call.   Continue orders as written. Reassess as needed.    Gunnar Bulla, CNM [facility], [facility]

## 2017-05-27 NOTE — Progress Notes (Signed)
Patient ID: Shanda BumpsJasmine Reyes Zavaleta, female   DOB: 02/18/1996, 22 y.o.   MRN: [id number]  Shanda BumpsJasmine Reyes Zavaleta is a 22 y.o. G1P0 at 3675w0d by ultrasound admitted for induction of labor due to Post dates. Due date 05/20/2017.  Subjective:  Doing well, just got out of the shower. Lying in bed, breathing through contractions. Family members at bedside for support. Requests doula.   Denies difficulty breathing or respiratory distress, chest pain, vaginal bleeding, leakage of fluid, or leg pain or swelling.   Objective:  Temp:  [98.3 F (36.8 C)-98.6 F (37 C)] 98.4 F (36.9 C) (03/20 1316) Pulse Rate:  [79-99] 79 (03/20 1317) Resp:  [16-18] 18 (03/20 1316) BP: (121-131)/(67-80) 121/70 (03/20 1317) Weight:  [204 lb (92.5 kg)] 204 lb (92.5 kg) (03/20 0514)  Fetal Wellbeing:  Category I   UC:   regular, every four (4) to six (6) minutes, soft resting tone  SVE:   Dilation: 4 Effacement (%): 90 Station: -1 Exam by:: Charlee Whitebread  Labs: Lab Results  Component Value Date   WBC 13.9 (H) 05/27/2017   HGB 12.1 05/27/2017   HCT 37.0 05/27/2017   MCV 80.8 05/27/2017   PLT 233 05/27/2017    Assessment:  Shanda BumpsJasmine Reyes Zavaleta is a 22 y.o. G1P0 at 5175w0d admitted for induction of labor due to postdates, Rh positive, GBS negative  FHR Category I  Plan:  "Doula blast" sent to volunteer doulas by RN.   Discussed nonpharmacologic pain management techniques.   Reviewed red flag symptoms and when to call.   Continue orders as written. Reassess as needed.    Gunnar BullaJenkins Michelle Chaunte Hornbeck, CNM [facility], [facility] 05/27/2017, 1:21 PM

## 2017-05-27 NOTE — Progress Notes (Signed)
Cytotec placed at 06:18. Pt up to bathroom at 06:51 and stated that the Cytotec was dislodged during her bowel movement d/t "forceful pushing". Midwife notified.

## 2017-05-28 ENCOUNTER — Inpatient Hospital Stay: Payer: BLUE CROSS/BLUE SHIELD | Admitting: Anesthesiology

## 2017-05-28 ENCOUNTER — Encounter
Admission: EM | Disposition: A | Discharge status: Home / Self Care | Payer: Self-pay | Source: Home / Self Care | Attending: Certified Nurse Midwife

## 2017-05-28 DIAGNOSIS — Z3A41 41 weeks gestation of pregnancy: Secondary | ICD-10-CM

## 2017-05-28 DIAGNOSIS — O480 Post-term pregnancy: Principal | ICD-10-CM

## 2017-05-28 LAB — CBC
HEMATOCRIT: 32.4 % — AB (ref 35.0–47.0)
HEMOGLOBIN: 10.6 g/dL — AB (ref 12.0–16.0)
MCH: 26.6 pg (ref 26.0–34.0)
MCHC: 32.9 g/dL (ref 32.0–36.0)
MCV: 80.9 fL (ref 80.0–100.0)
Platelets: 194 10*3/uL (ref 150–440)
RBC: 4 MIL/uL (ref 3.80–5.20)
RDW: 16.1 % — ABNORMAL HIGH (ref 11.5–14.5)
WBC: 17.7 10*3/uL — ABNORMAL HIGH (ref 3.6–11.0)

## 2017-05-28 LAB — RPR: RPR Ser Ql: NONREACTIVE

## 2017-05-28 SURGERY — Surgical Case
Anesthesia: Spinal

## 2017-05-28 MED ORDER — SIMETHICONE 80 MG PO CHEW: 80.0000 mg | CHEWABLE_TABLET | ORAL | Status: DC | PRN

## 2017-05-28 MED ORDER — SIMETHICONE 80 MG PO CHEW
CHEWABLE_TABLET | ORAL | Status: AC
  Administered 2017-05-28: 80 mg via ORAL
  Filled 2017-05-28: qty 1

## 2017-05-28 MED ORDER — COCONUT OIL OIL: 1.0000 "application " | TOPICAL_OIL | Status: DC | PRN

## 2017-05-28 MED ORDER — IBUPROFEN 600 MG PO TABS
600.0000 mg | ORAL_TABLET | Freq: Four times a day (QID) | ORAL | Status: DC
  Administered 2017-05-28 – 2017-05-30 (×9): 600 mg via ORAL
  Filled 2017-05-28 (×9): qty 1

## 2017-05-28 MED ORDER — MAGNESIUM HYDROXIDE 400 MG/5ML PO SUSP: 30.0000 mL | ORAL | Status: DC | PRN

## 2017-05-28 MED ORDER — FERROUS SULFATE 325 (65 FE) MG PO TABS
325.0000 mg | ORAL_TABLET | Freq: Two times a day (BID) | ORAL | Status: DC
  Administered 2017-05-28 – 2017-05-30 (×4): 325 mg via ORAL
  Filled 2017-05-28 (×4): qty 1

## 2017-05-28 MED ORDER — FENTANYL CITRATE (PF) 100 MCG/2ML IJ SOLN
INTRAMUSCULAR | Status: AC
  Filled 2017-05-28: qty 2

## 2017-05-28 MED ORDER — PRENATAL MULTIVITAMIN CH
1.0000 | ORAL_TABLET | Freq: Every day | ORAL | Status: DC
  Administered 2017-05-29 – 2017-05-30 (×2): 1 via ORAL
  Filled 2017-05-28 (×2): qty 1

## 2017-05-28 MED ORDER — PROMETHAZINE HCL 25 MG/ML IJ SOLN
12.5000 mg | Freq: Once | INTRAMUSCULAR | Status: AC
  Administered 2017-05-28: 12.5 mg via INTRAVENOUS
  Filled 2017-05-28: qty 1

## 2017-05-28 MED ORDER — FENTANYL CITRATE (PF) 100 MCG/2ML IJ SOLN
INTRAMUSCULAR | Status: AC
  Administered 2017-05-28: 25 ug via INTRAVENOUS
  Filled 2017-05-28: qty 2

## 2017-05-28 MED ORDER — ZOLPIDEM TARTRATE 5 MG PO TABS: 5.0000 mg | ORAL_TABLET | Freq: Every evening | ORAL | Status: DC | PRN

## 2017-05-28 MED ORDER — OXYCODONE-ACETAMINOPHEN 5-325 MG PO TABS: 2.0000 | ORAL_TABLET | ORAL | Status: DC | PRN

## 2017-05-28 MED ORDER — WITCH HAZEL-GLYCERIN EX PADS: 1.0000 "application " | MEDICATED_PAD | CUTANEOUS | Status: DC | PRN

## 2017-05-28 MED ORDER — SIMETHICONE 80 MG PO CHEW
80.0000 mg | CHEWABLE_TABLET | Freq: Once | ORAL | Status: AC
  Administered 2017-05-28: 80 mg via ORAL

## 2017-05-28 MED ORDER — DIPHENHYDRAMINE HCL 25 MG PO CAPS: 25.0000 mg | ORAL_CAPSULE | Freq: Four times a day (QID) | ORAL | Status: DC | PRN

## 2017-05-28 MED ORDER — ONDANSETRON HCL 4 MG/2ML IJ SOLN
4.0000 mg | Freq: Once | INTRAMUSCULAR | Status: AC | PRN
  Administered 2017-05-28: 4 mg via INTRAVENOUS
  Filled 2017-05-28: qty 2

## 2017-05-28 MED ORDER — MIDAZOLAM HCL 2 MG/2ML IJ SOLN
INTRAMUSCULAR | Status: AC
  Filled 2017-05-28: qty 2

## 2017-05-28 MED ORDER — MORPHINE SULFATE (PF) 0.5 MG/ML IJ SOLN
INTRAMUSCULAR | Status: AC
  Filled 2017-05-28: qty 10

## 2017-05-28 MED ORDER — LIDOCAINE 5 % EX PTCH
1.0000 | MEDICATED_PATCH | CUTANEOUS | Status: DC
  Administered 2017-05-29 – 2017-05-30 (×2): 1 via TRANSDERMAL
  Filled 2017-05-28 (×2): qty 1

## 2017-05-28 MED ORDER — SENNOSIDES-DOCUSATE SODIUM 8.6-50 MG PO TABS
2.0000 | ORAL_TABLET | ORAL | Status: DC
  Administered 2017-05-28: 2 via ORAL
  Filled 2017-05-28: qty 2

## 2017-05-28 MED ORDER — SOD CITRATE-CITRIC ACID 500-334 MG/5ML PO SOLN
ORAL | Status: AC
  Administered 2017-05-28: 30 mL via ORAL
  Filled 2017-05-28: qty 15

## 2017-05-28 MED ORDER — PROPOFOL 10 MG/ML IV BOLUS
INTRAVENOUS | Status: AC
  Filled 2017-05-28: qty 20

## 2017-05-28 MED ORDER — LIDOCAINE 5 % EX PTCH
MEDICATED_PATCH | CUTANEOUS | Status: AC
  Filled 2017-05-28: qty 1

## 2017-05-28 MED ORDER — DIBUCAINE 1 % RE OINT: 1.0000 "application " | TOPICAL_OINTMENT | RECTAL | Status: DC | PRN

## 2017-05-28 MED ORDER — OXYCODONE-ACETAMINOPHEN 5-325 MG PO TABS: 1.0000 | ORAL_TABLET | ORAL | Status: DC | PRN

## 2017-05-28 MED ORDER — FENTANYL CITRATE (PF) 100 MCG/2ML IJ SOLN
25.0000 ug | Freq: Once | INTRAMUSCULAR | Status: AC | PRN
  Administered 2017-05-28: 25 ug via INTRAVENOUS

## 2017-05-28 MED ORDER — SIMETHICONE 80 MG PO CHEW
80.0000 mg | CHEWABLE_TABLET | ORAL | Status: DC
  Administered 2017-05-28 – 2017-05-29 (×2): 80 mg via ORAL
  Filled 2017-05-28 (×2): qty 1

## 2017-05-28 MED ORDER — LACTATED RINGERS IV SOLN: INTRAVENOUS | Status: DC

## 2017-05-28 MED ORDER — ACETAMINOPHEN 325 MG PO TABS: 650.0000 mg | ORAL_TABLET | ORAL | Status: DC | PRN

## 2017-05-28 MED ORDER — MENTHOL 3 MG MT LOZG
1.0000 | LOZENGE | OROMUCOSAL | Status: DC | PRN
  Filled 2017-05-28: qty 9

## 2017-05-28 MED ORDER — OXYTOCIN 40 UNITS IN LACTATED RINGERS INFUSION - SIMPLE MED: 2.5000 [IU]/h | INTRAVENOUS | Status: AC

## 2017-05-28 MED ORDER — LIDOCAINE 5 % EX PTCH
MEDICATED_PATCH | CUTANEOUS | Status: DC | PRN
  Administered 2017-05-28: 1 via TRANSDERMAL

## 2017-05-28 SURGICAL SUPPLY — 28 items
BAG COUNTER SPONGE EZ (MISCELLANEOUS) ×4 IMPLANT
BENZOIN TINCTURE PRP APPL 2/3 (GAUZE/BANDAGES/DRESSINGS) ×3 IMPLANT
CANISTER SUCT 3000ML PPV (MISCELLANEOUS) ×3 IMPLANT
CHLORAPREP W/TINT 26ML (MISCELLANEOUS) ×6 IMPLANT
CLOSURE WOUND 1/2 X4 (GAUZE/BANDAGES/DRESSINGS) ×1
COUNTER SPONGE BAG EZ (MISCELLANEOUS) ×2
DRSG TELFA 3X8 NADH (GAUZE/BANDAGES/DRESSINGS) ×3 IMPLANT
ELECT REM PT RETURN 9FT ADLT (ELECTROSURGICAL) ×3
ELECTRODE REM PT RTRN 9FT ADLT (ELECTROSURGICAL) ×1 IMPLANT
GAUZE SPONGE 4X4 12PLY STRL (GAUZE/BANDAGES/DRESSINGS) ×3 IMPLANT
GLOVE BIO SURGEON STRL SZ 6.5 (GLOVE) ×10 IMPLANT
GLOVE BIO SURGEONS STRL SZ 6.5 (GLOVE) ×5
GLOVE INDICATOR 7.0 STRL GRN (GLOVE) ×3 IMPLANT
GOWN STRL REUS W/ TWL LRG LVL3 (GOWN DISPOSABLE) ×3 IMPLANT
GOWN STRL REUS W/TWL LRG LVL3 (GOWN DISPOSABLE) ×6
KIT TURNOVER KIT A (KITS) ×3 IMPLANT
NS IRRIG 1000ML POUR BTL (IV SOLUTION) ×3 IMPLANT
PACK C SECTION AR (MISCELLANEOUS) ×3 IMPLANT
PAD OB MATERNITY 4.3X12.25 (PERSONAL CARE ITEMS) ×3 IMPLANT
PAD PREP 24X41 OB/GYN DISP (PERSONAL CARE ITEMS) ×3 IMPLANT
SPONGE LAP 18X18 5 PK (GAUZE/BANDAGES/DRESSINGS) ×3 IMPLANT
STRIP CLOSURE SKIN 1/2X4 (GAUZE/BANDAGES/DRESSINGS) ×2 IMPLANT
SUT MNCRL AB 4-0 PS2 18 (SUTURE) ×3 IMPLANT
SUT PLAIN 2 0 XLH (SUTURE) IMPLANT
SUT VIC AB 0 CT1 36 (SUTURE) ×12 IMPLANT
SUT VIC AB 3-0 SH 27 (SUTURE) ×2
SUT VIC AB 3-0 SH 27X BRD (SUTURE) ×1 IMPLANT
SWABSTK COMLB BENZOIN TINCTURE (MISCELLANEOUS) ×3 IMPLANT

## 2017-05-28 NOTE — Progress Notes (Signed)
Came to assess patient as she was noted to continue to have a non-reassuring fetal tracing (Category 2) with variable decelerations despite interventions (position changes, amnioinfusion).  Patient is currently being managed by Serafina RoyalsMichelle Lawhorn, CNM.  Patient also remains 8 cm after 5 hours. MVU's initially adequate at 220, now decreased to 125.  Patient appears very uncomfortable with contractions.  Still declines epidural. Cervical exam also notes narrow introitus with prominent pubic arch.  Discussed that at this time we cannot augment her labor due to non-reassuring fetal tracing.  Recommended C-section as mode of delivery at this time. The risks of cesarean section discussed with the patient included but were not limited to: bleeding which may require transfusion or reoperation; infection which may require antibiotics; injury to bowel, bladder, ureters or other surrounding organs; injury to the fetus; need for additional procedures including hysterectomy in the event of a life-threatening hemorrhage; placental abnormalities wth subsequent pregnancies, incisional problems, thromboembolic phenomenon and other postoperative/anesthesia complications.  After much discussion with patient and her family, the patient concurred with the proposed plan, giving informed written consent for the procedure.   Patient has been NPO except clears since yesterday morning she will remain NPO for procedure. Anesthesia and OR aware. Preoperative prophylactic antibiotics and SCDs ordered on call to the OR.  To OR when ready.   Hildred Laserherry, Myalynn Lingle, MD [facility]

## 2017-05-28 NOTE — Lactation Note (Signed)
This note was copied from a baby's chart. Lactation Consultation Note  Patient Name: Carly Henry ZOXWR'UToday's Date: 05/28/2017 Reason for consult: Initial assessment   Maternal Data Formula Feeding for Exclusion: Yes Reason for exclusion: Mother's choice to formula and breast feed on admission Has patient been taught Hand Expression?: Yes  Feeding Feeding Type: Bottle Fed - Formula  LATCH Score Latch: Repeated attempts needed to sustain latch, nipple held in mouth throughout feeding, stimulation needed to elicit sucking reflex.  Audible Swallowing: A few with stimulation  Type of Nipple: Flat  Comfort (Breast/Nipple): Soft / non-tender  Hold (Positioning): Full assist, staff holds infant at breast  LATCH Score: 5  Interventions Interventions: Breast feeding basics reviewed;Position options  Mother wants to both breast and formula feed and even when explained why it may help her milk supply if she exclusively breast fed for the first few days of life she want to do both.     Consult Status Consult Status: PRN Follow-up type: In-patient    Trudee GripCarolyn P Kathee Tumlin 05/28/2017, 2:00 PM

## 2017-05-28 NOTE — Progress Notes (Addendum)
Patient ID: Carly Henry, female   DOB: 12/30/1995, 22 y.o.   MRN: [id number]  Carly Henry is a 22 y.o. G1P0 at 8075w1d by ultrasound admitted for induction of labor due to Post dates. Due date 05/26/2017.  Subjective:  Reports minimal pain relief with IV medications. Family members and doula present at bedside for continuous labor support.   Denies difficulty breathing or respiratory distress, chest pain, dysuria and leg pain or swelling.   Objective:  Temp:  [97.7 F (36.5 C)-98.8 F (37.1 C)] 98.7 F (37.1 C) (03/20 2326) Pulse Rate:  [79-99] 84 (03/21 0035) Resp:  [15-18] 15 (03/21 0035) BP: (116-131)/(67-90) 122/75 (03/21 0035) SpO2:  [95 %-100 %] 98 % (03/21 0035) Weight:  [204 lb (92.5 kg)] 204 lb (92.5 kg) (03/20 0514)  Fetal Wellbeing:  Category II   UC:   regular, every one (1) to four (4) minutes, soft resting tone  SVE:   Dilation: 8 Effacement (%): 100 Station: 0 Exam by:: Helene KelpM Stone, RN  Labs:  RecentLabs       Lab Results  Component Value Date   WBC 13.9 (H) 05/27/2017   HGB 12.1 05/27/2017   HCT 37.0 05/27/2017   MCV 80.8 05/27/2017   PLT 233 05/27/2017      Assessment:  Carly Reyes Zavaletais a 21 y.o.G1P0 at 7275w1d admitted for induction of labor due to postdates, Rh positive, GBS negative  FHR Category II  Plan:  Discussed actual vs expected labor progress and options for management including epidural placement, pitocin augmentation, and c-section. Patient would like to walk to stimulate contractions.   Education regarding fetal heart rate tracing and decelerations, patient and family verbalized understanding.   Reviewed red flag symptoms and when to call.   Continue orders as written. Reassess as needed.    Gunnar BullaJenkins Michelle Tiombe Tomeo, CNM [facility], [facility] 05/28/2017, 12:57 AM

## 2017-05-28 NOTE — Op Note (Signed)
Cesarean Section Procedure Note  Indications: non-reassuring fetal status  Pre-operative Diagnosis: 41 week 1 day pregnancy, fetal intolerance to labor, obesity (Class II).  Post-operative Diagnosis: Same  Surgeon: Hildred LaserAnika Georgeann Brinkman, MD  Assistants: Serafina RoyalsMichelle Lawhorn, CNM  Procedure: Primary/Repeat low transverse Cesarean Section  Anesthesia: Spinal anesthesia  Procedure Details: The patient was seen in the Holding Room. The risks, benefits, complications, treatment options, and expected outcomes were discussed with the patient.  The patient concurred with the proposed plan, giving informed consent.  The site of surgery properly noted/marked. The patient was taken to the Operating Room, [facility] and the procedure verified as C-Section Delivery. A Time Out was held and the above information confirmed.  After induction of anesthesia, the patient was draped and prepped in the usual sterile manner. Anesthesia was tested and noted to be adequate. A Pfannenstiel incision was made and carried down through the subcutaneous tissue to the fascia. Fascial incision was made and extended transversely. The fascia was separated from the underlying rectus tissue superiorly and inferiorly. The peritoneum was identified and entered. Peritoneal incision was extended longitudinally. The utero-vesical peritoneal reflection was incised transversely and the bladder flap was bluntly freed from the lower uterine segment. A low transverse uterine incision was made. Delivered from cephalic presentation (direct OP position) was a 3200 gram Female with Apgar scores of 9 at one minute and 9 at five minutes. Delayed cord clamping was observed for 1 minute. The umbilical cord was clamped and cut.  No cord blood was obtained for evaluation. The placenta was removed intact and appeared normal. The uterus was exteriorized and cleared of all clots and debris. The uterine outline, tubes and ovaries appeared  normal.  The uterine incision was closed with running locked sutures of 0-Vicryl.  A second suture of 0-Vicryl was used in an imbricating layer.  Hemostasis was observed. Lavage was carried out until clear. The fascia was then reapproximated with a running suture of 0-Vicryl. The subcutaneous fat layer was reapproximated with 3-0 Vicryl. The skin was reapproximated with 4-0 Monocryl.  Instrument, sponge, and needle counts were correct prior the abdominal closure and at the conclusion of the case.   Findings: Female infant, cephalic presentation, 3200 grams, with Apgar scores of 9 at one minute and 9 at five minutes. Direct OP position Body cord present.  Intact placenta with 3 vessel cord.   Clear amniotic fluid at rupture The uterine outline, tubes and ovaries appeared normal.   Estimated Blood Loss:  700 ml      Drains: foley catheter to gravity drainage, 200 ml clear urine at end of the procedure         Total IV Fluids:  900 ml  Specimens: None         Implants: None         Complications:  None; patient tolerated the procedure well.         Disposition: PACU - hemodynamically stable.         Condition: stable    Hildred Laserherry, Trevaris Pennella, MD [facility]

## 2017-05-29 ENCOUNTER — Encounter: Payer: Self-pay | Admitting: Obstetrics and Gynecology

## 2017-05-29 NOTE — Progress Notes (Signed)
Postpartum Day # 1: Cesarean Delivery  Subjective: Patient reports tolerating PO, + flatus and no problems voiding.  Denies any pain currently.  Ambulating without difficulty.  Is breast feeding.   Objective: Vital signs in last 24 hours: Temp:  [97.5 F (36.4 C)-98.8 F (37.1 C)] 98.1 F (36.7 C) (03/22 0843) Pulse Rate:  [72-99] 74 (03/22 0843) Resp:  [17-18] 18 (03/22 0843) BP: (109-118)/(56-65) 116/65 (03/22 0843) SpO2:  [97 %-100 %] 100 % (03/22 0843)  Physical Exam:  General: alert and no distress Lungs: clear to auscultation bilaterally Breasts: normal appearance, no masses or tenderness Heart: regular rate and rhythm, S1, S2 normal, no murmur, click, rub or gallop Abdomen: soft, non-tender; bowel sounds normal; no masses,  no organomegaly Pelvis: Lochia appropriate, Uterine Fundus firm, Incision: healing well, no significant drainage, no dehiscence, no significant erythema Extremities: DVT Evaluation: No evidence of DVT seen on physical exam. Negative Homan's sign. No cords or calf tenderness. No significant calf/ankle edema.   Recent Labs    05/27/17 0555 05/28/17 0728  HGB 12.1 10.6*  HCT 37.0 32.4*    Assessment/Plan: Status post Cesarean section. Doing well postoperatively.  Regular diet as tolerated Continue PO pain management Breastfeeding, lactation consult as needed Continue current care.  Plan for discharge tomorrow   Hildred LaserAnika Cordero Surette, MD [facility]

## 2017-05-29 NOTE — Progress Notes (Signed)
Post Partum Day 1, s/p c-section  Subjective:  Doing well, no questions or concerns. Family members and infant at bedside.   Denies difficulty breathing or respiratory distress, chest pain, abdominal pain, excessive vaginal bleeding, dysuria, and leg pain or swelling.   Objective:  Temp:  [98.1 F (36.7 C)-98.8 F (37.1 C)] 98.5 F (36.9 C) (03/22 1532) Pulse Rate:  [74-99] 75 (03/22 1532) Resp:  [18] 18 (03/22 1532) BP: (109-118)/(59-65) 116/64 (03/22 1532) SpO2:  [97 %-100 %] 98 % (03/22 1532)  Physical Exam:   General: alert and cooperative  Lochia: appropriate  Uterine Fundus: firm  Incision: healing well, no significant drainage, no dehiscence, no significant erythema  DVT Evaluation: No evidence of DVT seen on physical exam.  Recent Labs    05/27/17 0555 05/28/17 0728  HGB 12.1 10.6*  HCT 37.0 32.4*    Assessment/Plan: Plan for discharge tomorrow and Breastfeeding   LOS: 2 days    Gunnar BullaJenkins Michelle Sharene Krikorian, CNM [facility], [facility] 05/29/2017, 4:51 PM

## 2017-05-30 MED ORDER — IBUPROFEN 600 MG PO TABS: 600.0000 mg | ORAL_TABLET | Freq: Four times a day (QID) | ORAL | 0 refills | Status: DC

## 2017-05-30 MED ORDER — OXYCODONE-ACETAMINOPHEN 5-325 MG PO TABS: 1.0000 | ORAL_TABLET | ORAL | 0 refills | Status: DC | PRN

## 2017-05-30 MED ORDER — DOCUSATE SODIUM 100 MG PO CAPS: 100.0000 mg | ORAL_CAPSULE | Freq: Two times a day (BID) | ORAL | Status: DC | PRN

## 2017-05-30 MED ORDER — FERROUS SULFATE 325 (65 FE) MG PO TABS: 325.0000 mg | ORAL_TABLET | Freq: Two times a day (BID) | ORAL | 3 refills | Status: DC

## 2017-05-30 MED ORDER — DOCUSATE SODIUM 100 MG PO CAPS: 100.0000 mg | ORAL_CAPSULE | Freq: Two times a day (BID) | ORAL | 0 refills | Status: DC | PRN

## 2017-05-30 NOTE — Discharge Instructions (Signed)
Breastfeeding Choosing to breastfeed is one of the best decisions you can make for yourself and your baby. A change in hormones during pregnancy causes your breasts to make breast milk in your milk-producing glands. Hormones prevent breast milk from being released before your baby is born. They also prompt milk flow after birth. Once breastfeeding has begun, thoughts of your baby, as well as his or her sucking or crying, can stimulate the release of milk from your milk-producing glands. Benefits of breastfeeding Research shows that breastfeeding offers many health benefits for infants and mothers. It also offers a cost-free and convenient way to feed your baby. For your baby  Your first milk (colostrum) helps your baby's digestive system to function better.  Special cells in your milk (antibodies) help your baby to fight off infections.  Breastfed babies are less likely to develop asthma, allergies, obesity, or type 2 diabetes. They are also at lower risk for sudden infant death syndrome (SIDS).  Nutrients in breast milk are better able to meet your babys needs compared to infant formula.  Breast milk improves your baby's brain development. For you  Breastfeeding helps to create a very special bond between you and your baby.  Breastfeeding is convenient. Breast milk costs nothing and is always available at the correct temperature.  Breastfeeding helps to burn calories. It helps you to lose the weight that you gained during pregnancy.  Breastfeeding makes your uterus return faster to its size before pregnancy. It also slows bleeding (lochia) after you give birth.  Breastfeeding helps to lower your risk of developing type 2 diabetes, osteoporosis, rheumatoid arthritis, cardiovascular disease, and breast, ovarian, uterine, and endometrial cancer later in life. Breastfeeding basics Starting breastfeeding  Find a comfortable place to sit or lie down, with your neck and back  well-supported.  Place a pillow or a rolled-up blanket under your baby to bring him or her to the level of your breast (if you are seated). Nursing pillows are specially designed to help support your arms and your baby while you breastfeed.  Make sure that your baby's tummy (abdomen) is facing your abdomen.  Gently massage your breast. With your fingertips, massage from the outer edges of your breast inward toward the nipple. This encourages milk flow. If your milk flows slowly, you may need to continue this action during the feeding.  Support your breast with 4 fingers underneath and your thumb above your nipple (make the letter "C" with your hand). Make sure your fingers are well away from your nipple and your babys mouth.  Stroke your baby's lips gently with your finger or nipple.  When your baby's mouth is open wide enough, quickly bring your baby to your breast, placing your entire nipple and as much of the areola as possible into your baby's mouth. The areola is the colored area around your nipple. ? More areola should be visible above your baby's upper lip than below the lower lip. ? Your baby's lips should be opened and extended outward (flanged) to ensure an adequate, comfortable latch. ? Your baby's tongue should be between his or her lower gum and your breast.  Make sure that your baby's mouth is correctly positioned around your nipple (latched). Your baby's lips should create a seal on your breast and be turned out (everted).  It is common for your baby to suck about 2-3 minutes in order to start the flow of breast milk. Latching Teaching your baby how to latch onto your breast properly is very  important. An improper latch can cause nipple pain, decreased milk supply, and poor weight gain in your baby. Also, if your baby is not latched onto your nipple properly, he or she may swallow some air during feeding. This can make your baby fussy. Burping your baby when you switch breasts  during the feeding can help to get rid of the air. However, teaching your baby to latch on properly is still the best way to prevent fussiness from swallowing air while breastfeeding. °Signs that your baby has successfully latched onto your nipple °· Silent tugging or silent sucking, without causing you pain. Infant's lips should be extended outward (flanged). °· Swallowing heard between every 3-4 sucks once your milk has started to flow (after your let-down milk reflex occurs). °· Muscle movement above and in front of his or her ears while sucking. ° °Signs that your baby has not successfully latched onto your nipple °· Sucking sounds or smacking sounds from your baby while breastfeeding. °· Nipple pain. ° °If you think your baby has not latched on correctly, slip your finger into the corner of your baby’s mouth to break the suction and place it between your baby's gums. Attempt to start breastfeeding again. °Signs of successful breastfeeding °Signs from your baby °· Your baby will gradually decrease the number of sucks or will completely stop sucking. °· Your baby will fall asleep. °· Your baby's body will relax. °· Your baby will retain a small amount of milk in his or her mouth. °· Your baby will let go of your breast by himself or herself. ° °Signs from you °· Breasts that have increased in firmness, weight, and size 1-3 hours after feeding. °· Breasts that are softer immediately after breastfeeding. °· Increased milk volume, as well as a change in milk consistency and color by the fifth day of breastfeeding. °· Nipples that are not sore, cracked, or bleeding. ° °Signs that your baby is getting enough milk °· Wetting at least 1-2 diapers during the first 24 hours after birth. °· Wetting at least 5-6 diapers every 24 hours for the first week after birth. The urine should be clear or pale yellow by the age of 5 days. °· Wetting 6-8 diapers every 24 hours as your baby continues to grow and develop. °· At least 3  stools in a 24-hour period by the age of 5 days. The stool should be soft and yellow. °· At least 3 stools in a 24-hour period by the age of 7 days. The stool should be seedy and yellow. °· No loss of weight greater than 10% of birth weight during the first 3 days of life. °· Average weight gain of 4-7 oz (113-198 g) per week after the age of 4 days. °· Consistent daily weight gain by the age of 5 days, without weight loss after the age of 2 weeks. °After a feeding, your baby may spit up a small amount of milk. This is normal. °Breastfeeding frequency and duration °Frequent feeding will help you make more milk and can prevent sore nipples and extremely full breasts (breast engorgement). Breastfeed when you feel the need to reduce the fullness of your breasts or when your baby shows signs of hunger. This is called "breastfeeding on demand." Signs that your baby is hungry include: °· Increased alertness, activity, or restlessness. °· Movement of the head from side to side. °· Opening of the mouth when the corner of the mouth or cheek is stroked (rooting). °· Increased sucking sounds,   smacking lips, cooing, sighing, or squeaking. °· Hand-to-mouth movements and sucking on fingers or hands. °· Fussing or crying. ° °Avoid introducing a pacifier to your baby in the first 4-6 weeks after your baby is born. After this time, you may choose to use a pacifier. Research has shown that pacifier use during the first year of a baby's life decreases the risk of sudden infant death syndrome (SIDS). °Allow your baby to feed on each breast as long as he or she wants. When your baby unlatches or falls asleep while feeding from the first breast, offer the second breast. Because newborns are often sleepy in the first few weeks of life, you may need to awaken your baby to get him or her to feed. °Breastfeeding times will vary from baby to baby. However, the following rules can serve as a guide to help you make sure that your baby is  properly fed: °· Newborns (babies 4 weeks of age or younger) may breastfeed every 1-3 hours. °· Newborns should not go without breastfeeding for longer than 3 hours during the day or 5 hours during the night. °· You should breastfeed your baby a minimum of 8 times in a 24-hour period. ° °Breast milk pumping °Pumping and storing breast milk allows you to make sure that your baby is exclusively fed your breast milk, even at times when you are unable to breastfeed. This is especially important if you go back to work while you are still breastfeeding, or if you are not able to be present during feedings. Your lactation consultant can help you find a method of pumping that works best for you and give you guidelines about how long it is safe to store breast milk. °Caring for your breasts while you breastfeed °Nipples can become dry, cracked, and sore while breastfeeding. The following recommendations can help keep your breasts moisturized and healthy: °· Avoid using soap on your nipples. °· Wear a supportive bra designed especially for nursing. Avoid wearing underwire-style bras or extremely tight bras (sports bras). °· Air-dry your nipples for 3-4 minutes after each feeding. °· Use only cotton bra pads to absorb leaked breast milk. Leaking of breast milk between feedings is normal. °· Use lanolin on your nipples after breastfeeding. Lanolin helps to maintain your skin's normal moisture barrier. Pure lanolin is not harmful (not toxic) to your baby. You may also hand express a few drops of breast milk and gently massage that milk into your nipples and allow the milk to air-dry. ° °In the first few weeks after giving birth, some women experience breast engorgement. Engorgement can make your breasts feel heavy, warm, and tender to the touch. Engorgement peaks within 3-5 days after you give birth. The following recommendations can help to ease engorgement: °· Completely empty your breasts while breastfeeding or pumping. You  may want to start by applying warm, moist heat (in the shower or with warm, water-soaked hand towels) just before feeding or pumping. This increases circulation and helps the milk flow. If your baby does not completely empty your breasts while breastfeeding, pump any extra milk after he or she is finished. °· Apply ice packs to your breasts immediately after breastfeeding or pumping, unless this is too uncomfortable for you. To do this: °? Put ice in a plastic bag. °? Place a towel between your skin and the bag. °? Leave the ice on for 20 minutes, 2-3 times a day. °· Make sure that your baby is latched on and positioned properly while   breastfeeding.  If engorgement persists after 48 hours of following these recommendations, contact your health care provider or a Advertising copywriterlactation consultant. Overall health care recommendations while breastfeeding  Eat 3 healthy meals and 3 snacks every day. Well-nourished mothers who are breastfeeding need an additional 450-500 calories a day. You can meet this requirement by increasing the amount of a balanced diet that you eat.  Drink enough water to keep your urine pale yellow or clear.  Rest often, relax, and continue to take your prenatal vitamins to prevent fatigue, stress, and low vitamin and mineral levels in your body (nutrient deficiencies).  Do not use any products that contain nicotine or tobacco, such as cigarettes and e-cigarettes. Your baby may be harmed by chemicals from cigarettes that pass into breast milk and exposure to secondhand smoke. If you need help quitting, ask your health care provider.  Avoid alcohol.  Do not use illegal drugs or marijuana.  Talk with your health care provider before taking any medicines. These include over-the-counter and prescription medicines as well as vitamins and herbal supplements. Some medicines that may be harmful to your baby can pass through breast milk.  It is possible to become pregnant while breastfeeding. If  birth control is desired, ask your health care provider about options that will be safe while breastfeeding your baby. Where to find more information: [employer]: www.llli.org Contact a health care provider if:  You feel like you want to stop breastfeeding or have become frustrated with breastfeeding.  Your nipples are cracked or bleeding.  Your breasts are red, tender, or warm.  You have: ? Painful breasts or nipples. ? A swollen area on either breast. ? A fever or chills. ? Nausea or vomiting. ? Drainage other than breast milk from your nipples.  Your breasts do not become full before feedings by the fifth day after you give birth.  You feel sad and depressed.  Your baby is: ? Too sleepy to eat well. ? Having trouble sleeping. ? More than 731 week old and wetting fewer than 6 diapers in a 24-hour period. ? Not gaining weight by 365 days of age.  Your baby has fewer than 3 stools in a 24-hour period.  Your baby's skin or the white parts of his or her eyes become yellow. Get help right away if:  Your baby is overly tired (lethargic) and does not want to wake up and feed.  Your baby develops an unexplained fever. Summary  Breastfeeding offers many health benefits for infant and mothers.  Try to breastfeed your infant when he or she shows early signs of hunger.  Gently tickle or stroke your baby's lips with your finger or nipple to allow the baby to open his or her mouth. Bring the baby to your breast. Make sure that much of the areola is in your baby's mouth. Offer one side and burp the baby before you offer the other side.  Talk with your health care provider or lactation consultant if you have questions or you face problems as you breastfeed. This information is not intended to replace advice given to you by your health care provider. Make sure you discuss any questions you have with your health care provider. Document Released: 02/24/2005 Document  Revised: 03/28/2016 Document Reviewed: 03/28/2016 Elsevier Interactive Patient Education  2018 [employer] Cesarean Delivery, Care After Refer to this sheet in the next few weeks. These instructions provide you with information about caring for yourself after your procedure. Your health care  provider may also give you more specific instructions. Your treatment has been planned according to current medical practices, but problems sometimes occur. Call your health care provider if you have any problems or questions after your procedure. What can I expect after the procedure? After the procedure, it is common to have:  A small amount of blood or clear fluid coming from the incision.  Some redness, swelling, and pain in your incision area.  Some abdominal pain and soreness.  Vaginal bleeding (lochia).  Pelvic cramps.  Fatigue.  Follow these instructions at home: Incision care   Follow instructions from your health care provider about how to take care of your incision. Make sure you: ? Wash your hands with soap and water before you change your bandage (dressing). If soap and water are not available, use hand sanitizer. ? If you have a dressing, change it as told by your health care provider. ? Leave stitches (sutures), skin staples, skin glue, or adhesive strips in place. These skin closures may need to stay in place for 2 weeks or longer. If adhesive strip edges start to loosen and curl up, you may trim the loose edges. Do not remove adhesive strips completely unless your health care provider tells you to do that.  Check your incision area every day for signs of infection. Check for: ? More redness, swelling, or pain. ? More fluid or blood. ? Warmth. ? Pus or a bad smell.  When you cough or sneeze, hug a pillow. This helps with pain and decreases the chance of your incision opening up (dehiscing). Do this until your incision heals. Medicines  Take over-the-counter and prescription  medicines only as told by your health care provider.  If you were prescribed an antibiotic medicine, take it as told by your health care provider. Do not stop taking the antibiotic until it is finished. Driving  Do not drive or operate heavy machinery while taking prescription pain medicine. Lifestyle  Do not drink alcohol. This is especially important if you are breastfeeding or taking pain medicine.  Do not use tobacco products, including cigarettes, chewing tobacco, or e-cigarettes. If you need help quitting, ask your health care provider. Tobacco can delay wound healing. Eating and drinking  Drink at least 8 eight-ounce glasses of water every day unless told not to by your health care provider. If you breastfeed, you may need to drink more water than this.  Eat high-fiber foods every day. These foods may help prevent or relieve constipation. High-fiber foods include: ? Whole grain cereals and breads. ? Brown rice. ? Beans. ? Fresh fruits and vegetables. Activity  Return to your normal activities as told by your health care provider. Ask your health care provider what activities are safe for you.  Rest as much as possible. Try to rest or take a nap while your baby is sleeping.  Do not lift anything that is heavier than your baby or 10 lb (4.5 kg) as told by your health care provider.  Ask your health care provider when you can engage in sexual activity. This may depend on your: ? Risk of infection. ? Healing rate. ? Comfort and desire to engage in sexual activity. Bathing  Do not take baths, swim, or use a hot tub until your health care provider approves. Ask your health care provider if you can take showers. You may only be allowed to take sponge baths until your incision heals. General instructions  Do not use tampons or douches until your health  care provider approves.  Wear: ? Loose, comfortable clothing. ? A supportive and well-fitting bra.  Watch for any blood  clots that may pass from your vagina. These may look like clumps of dark red, brown, or black discharge.  Keep your perineum clean and dry as told by your health care provider.  Wipe from front to back when you use the toilet.  If possible, have someone help you care for your baby and help with household activities for a few days after you leave the hospital.  Keep all follow-up visits for you and your baby as told by your health care provider. This is important. Contact a health care provider if:  You have: ? Bad-smelling vaginal discharge. ? Difficulty urinating. ? Pain when urinating. ? A sudden increase or decrease in the frequency of your bowel movements. ? More redness, swelling, or pain around your incision. ? More fluid or blood coming from your incision. ? Pus or a bad smell coming from your incision. ? A fever. ? A rash. ? Little or no interest in activities you used to enjoy. ? Questions about caring for yourself or your baby. ? Nausea.  Your incision feels warm to the touch.  Your breasts turn red or become painful or hard.  You feel unusually sad or worried.  You vomit.  You pass large blood clots from your vagina. If you pass a blood clot, save it to show to your health care provider. Do not flush blood clots down the toilet without showing your health care provider.  You urinate more than usual.  You are dizzy or light-headed.  You have not breastfed and have not had a menstrual period for 12 weeks after delivery.  You stopped breastfeeding and have not had a menstrual period for 12 weeks after stopping breastfeeding. Get help right away if:  You have: ? Pain that does not go away or get better with medicine. ? Chest pain. ? Difficulty breathing. ? Blurred vision or spots in your vision. ? Thoughts about hurting yourself or your baby. ? New pain in your abdomen or in one of your legs. ? A severe headache.  You faint.  You bleed from your vagina so  much that you fill two sanitary pads in one hour. This information is not intended to replace advice given to you by your health care provider. Make sure you discuss any questions you have with your health care provider. Document Released: 11/16/2001 Document Revised: 03/29/2016 Document Reviewed: 01/29/2015 Elsevier Interactive Patient Education  [employer]2018 Elsevier Inc.

## 2017-05-30 NOTE — Progress Notes (Signed)
Pt discharged with infant.  Discharge instructions, prescriptions and follow up appointment given to and reviewed with pt. Pt verbalized understanding. Escorted out by auxillary. 

## 2017-05-30 NOTE — Discharge Summary (Signed)
Physician Obstetric Discharge Summary  Patient ID: Carly Henry MRN: [id number] DOB/AGE: 23/04/1995 23 y.o.   Date of Admission: 05/27/2017 Carly Henry, CNM Horton Marshall(A. Cherry, MD)  Date of Discharge: 05/30/2017 Carly Henry, CNM Charlena Cross(D. Evans, MD)  Admitting Diagnosis: Induction of labor at 7837w1d  Secondary Diagnosis: Anemia in pregnancy  Mode of Delivery: primary cesarean sectionlow uterine, transverse     Discharge Diagnosis: Failed induction of labor   Intrapartum Procedures: Atificial rupture of membranes, placement of fetal scalp electrode, placement of intrauterine catheter and cytotec   Post partum procedures: None  Complications: none   Brief Hospital Course    [facility] a G1P1001 who underwent cesarean section on 05/28/2017.  Patient had an uncomplicated surgery; for further details of this surgery, please refer to the operative note.  Patient had an uncomplicated postpartum course.  By time of discharge on POD#2/PPD#2, her pain was controlled on oral pain medications; she had appropriate lochia and was ambulating, voiding without difficulty, tolerating regular diet and passing flatus.   She was deemed stable for discharge to home.    Labs: CBC Latest Ref Rng & Units 05/28/2017 05/27/2017 04/09/2017  WBC 3.6 - 11.0 K/uL 17.7(H) 13.9(H) 11.3(H)  Hemoglobin 12.0 - 16.0 g/dL 10.6(L) 12.1 12.2  Hematocrit 35.0 - 47.0 % 32.4(L) 37.0 37.0  Platelets 150 - 440 K/uL 194 233 252   A POS  Physical exam:   Blood pressure 120/62, pulse 98, temperature 98 F (36.7 C), temperature source Oral, resp. rate 20, height 5\' 2"  (1.575 m), weight 204 lb (92.5 kg), last menstrual period 08/04/2016, SpO2 100 %, unknown if currently breastfeeding.  General: alert and no distress  Lochia: appropriate  Abdomen: soft, NT  Uterine Fundus: firm  Incision: healing well, no significant drainage, no dehiscence, no significant erythema  Extremities: No evidence of DVT  seen on physical exam. No lower extremity edema.  Discharge Instructions: Per After Visit Summary.  Activity: Advance as tolerated. Pelvic rest for 6 weeks.  Also refer to After Visit Summary  Diet: Regular  Medications:  Allergies as of 05/30/2017   No Known Allergies     Medication List    TAKE these medications   docusate sodium 100 MG capsule Commonly known as:  COLACE Take 1 capsule (100 mg total) by mouth 2 (two) times daily as needed for mild constipation.   ferrous sulfate 325 (65 FE) MG tablet Take 1 tablet (325 mg total) by mouth 2 (two) times daily with a meal.   ibuprofen 600 MG tablet Commonly known as:  ADVIL,MOTRIN Take 1 tablet (600 mg total) by mouth every 6 (six) hours.   oxyCODONE-acetaminophen 5-325 MG tablet Commonly known as:  PERCOCET/ROXICET Take 1 tablet by mouth every 4 (four) hours as needed (pain scale 4-7).   Prenatal Vitamins 28-0.8 MG Tabs Take by mouth.      Outpatient follow up:  Follow-up Information    Carly Henry, CNM. Call.   Specialties:  Certified Nurse Midwife, Obstetrics and Gynecology, Radiology Why:  Please call to schedule incision check on Thursday with JML Contact information: [address] [place] [id number] [phone]          Postpartum contraception: NuvaRing vaginal inserts  Discharged Condition: stable  Discharged to: home   Newborn Data:  Disposition:home with mother  Apgars: APGAR (1 MIN): 9   APGAR (5 MINS): 9    Baby Feeding: Breast   Carly Henry, CNM [facility], [facility] 06/01/17  10:13 AM

## 2017-06-04 ENCOUNTER — Ambulatory Visit (INDEPENDENT_AMBULATORY_CARE_PROVIDER_SITE_OTHER): Payer: BLUE CROSS/BLUE SHIELD | Admitting: Certified Nurse Midwife

## 2017-06-04 VITALS — BP 109/74 | HR 78 | Ht 62.0 in | Wt 190.0 lb

## 2017-06-04 DIAGNOSIS — O34219 Maternal care for unspecified type scar from previous cesarean delivery: Secondary | ICD-10-CM | POA: Insufficient documentation

## 2017-06-04 DIAGNOSIS — Z98891 History of uterine scar from previous surgery: Secondary | ICD-10-CM

## 2017-06-04 DIAGNOSIS — Z4889 Encounter for other specified surgical aftercare: Secondary | ICD-10-CM

## 2017-06-04 NOTE — Progress Notes (Signed)
    OBSTETRICS/GYNECOLOGY POST-OPERATIVE CLINIC VISIT  Subjective:     Carly Henry is a 22 y.o. female who presents to the clinic 1 weeks status post primary c-section for fetal intolerance to labor, failure to progress. Eating a regular diet without difficulty. Bowel movements are normal. Pain is controlled with current analgesics. Medications being used: ibuprofen (OTC).   Denies difficulty breathing or respiratory distress, chest pain, abdominal pain, excessive vaginal bleeding, dysuria, and leg pain or swelling.   The following portions of the patient's history were reviewed and updated as appropriate: allergies, current medications, past family history, past medical history, past social history, past surgical history and problem list.  Review of Systems Pertinent items are noted in HPI.    Objective:    BP 109/74   Pulse 78   Ht 5\' 2"  (1.575 m)   Wt 190 lb (86.2 kg)   BMI 34.75 kg/m    General:  alert and no distress  Abdomen: soft, bowel sounds active, non-tender  Incision:   healing well, no drainage, no erythema, no hernia, no seroma, no swelling, no dehiscence, incision well approximated    Assessment:    Doing well postoperatively.  S/P primary cesarean section   Plan:   1. Continue any current medications. 2. Wound care discussed. 3. Reviewed red flag symptoms and when to call.  4. Follow up: 5 weeks for PPV with JML or sooner if needed.    Gunnar BullaJenkins Michelle Lucretia Pendley, CNM [facility], [facility]

## 2017-06-04 NOTE — Progress Notes (Signed)
Pt is here for an incision check. Pt has some questions also.

## 2017-06-04 NOTE — Patient Instructions (Signed)
Cesarean Delivery, Care After Refer to this sheet in the next few weeks. These instructions provide you with information about caring for yourself after your procedure. Your health care provider may also give you more specific instructions. Your treatment has been planned according to current medical practices, but problems sometimes occur. Call your health care provider if you have any problems or questions after your procedure. What can I expect after the procedure? After the procedure, it is common to have:  A small amount of blood or clear fluid coming from the incision.  Some redness, swelling, and pain in your incision area.  Some abdominal pain and soreness.  Vaginal bleeding (lochia).  Pelvic cramps.  Fatigue.  Follow these instructions at home: Incision care   Follow instructions from your health care provider about how to take care of your incision. Make sure you: ? Wash your hands with soap and water before you change your bandage (dressing). If soap and water are not available, use hand sanitizer. ? If you have a dressing, change it as told by your health care provider. ? Leave stitches (sutures), skin staples, skin glue, or adhesive strips in place. These skin closures may need to stay in place for 2 weeks or longer. If adhesive strip edges start to loosen and curl up, you may trim the loose edges. Do not remove adhesive strips completely unless your health care provider tells you to do that.  Check your incision area every day for signs of infection. Check for: ? More redness, swelling, or pain. ? More fluid or blood. ? Warmth. ? Pus or a bad smell.  When you cough or sneeze, hug a pillow. This helps with pain and decreases the chance of your incision opening up (dehiscing). Do this until your incision heals. Medicines  Take over-the-counter and prescription medicines only as told by your health care provider.  If you were prescribed an antibiotic medicine, take it  as told by your health care provider. Do not stop taking the antibiotic until it is finished. Driving  Do not drive or operate heavy machinery while taking prescription pain medicine. Lifestyle  Do not drink alcohol. This is especially important if you are breastfeeding or taking pain medicine.  Do not use tobacco products, including cigarettes, chewing tobacco, or e-cigarettes. If you need help quitting, ask your health care provider. Tobacco can delay wound healing. Eating and drinking  Drink at least 8 eight-ounce glasses of water every day unless told not to by your health care provider. If you breastfeed, you may need to drink more water than this.  Eat high-fiber foods every day. These foods may help prevent or relieve constipation. High-fiber foods include: ? Whole grain cereals and breads. ? Brown rice. ? Beans. ? Fresh fruits and vegetables. Activity  Return to your normal activities as told by your health care provider. Ask your health care provider what activities are safe for you.  Rest as much as possible. Try to rest or take a nap while your baby is sleeping.  Do not lift anything that is heavier than your baby or 10 lb (4.5 kg) as told by your health care provider.  Ask your health care provider when you can engage in sexual activity. This may depend on your: ? Risk of infection. ? Healing rate. ? Comfort and desire to engage in sexual activity. Bathing  Do not take baths, swim, or use a hot tub until your health care provider approves. Ask your health care provider if   you can take showers. You may only be allowed to take sponge baths until your incision heals. General instructions  Do not use tampons or douches until your health care provider approves.  Wear: ? Loose, comfortable clothing. ? A supportive and well-fitting bra.  Watch for any blood clots that may pass from your vagina. These may look like clumps of dark red, brown, or black discharge.  Keep  your perineum clean and dry as told by your health care provider.  Wipe from front to back when you use the toilet.  If possible, have someone help you care for your baby and help with household activities for a few days after you leave the hospital.  Keep all follow-up visits for you and your baby as told by your health care provider. This is important. Contact a health care provider if:  You have: ? Bad-smelling vaginal discharge. ? Difficulty urinating. ? Pain when urinating. ? A sudden increase or decrease in the frequency of your bowel movements. ? More redness, swelling, or pain around your incision. ? More fluid or blood coming from your incision. ? Pus or a bad smell coming from your incision. ? A fever. ? A rash. ? Little or no interest in activities you used to enjoy. ? Questions about caring for yourself or your baby. ? Nausea.  Your incision feels warm to the touch.  Your breasts turn red or become painful or hard.  You feel unusually sad or worried.  You vomit.  You pass large blood clots from your vagina. If you pass a blood clot, save it to show to your health care provider. Do not flush blood clots down the toilet without showing your health care provider.  You urinate more than usual.  You are dizzy or light-headed.  You have not breastfed and have not had a menstrual period for 12 weeks after delivery.  You stopped breastfeeding and have not had a menstrual period for 12 weeks after stopping breastfeeding. Get help right away if:  You have: ? Pain that does not go away or get better with medicine. ? Chest pain. ? Difficulty breathing. ? Blurred vision or spots in your vision. ? Thoughts about hurting yourself or your baby. ? New pain in your abdomen or in one of your legs. ? A severe headache.  You faint.  You bleed from your vagina so much that you fill two sanitary pads in one hour. This information is not intended to replace advice given to  you by your health care provider. Make sure you discuss any questions you have with your health care provider. Document Released: 11/16/2001 Document Revised: 03/29/2016 Document Reviewed: 01/29/2015 Elsevier Interactive Patient Education  2018 [place] 18-39 Years, Female Preventive care refers to lifestyle choices and visits with your health care provider that can promote health and wellness. What does preventive care include?  A yearly physical exam. This is also called an annual well check.  Dental exams once or twice a year.  Routine eye exams. Ask your health care provider how often you should have your eyes checked.  Personal lifestyle choices, including: ? Daily care of your teeth and gums. ? Regular physical activity. ? Eating a healthy diet. ? Avoiding tobacco and drug use. ? Limiting alcohol use. ? Practicing safe sex. ? Taking vitamin and mineral supplements as recommended by your health care provider. What happens during an annual well check? The services and screenings done by your health care provider during  your annual well check will depend on your age, overall health, lifestyle risk factors, and family history of disease. Counseling Your health care provider may ask you questions about your:  Alcohol use.  Tobacco use.  Drug use.  Emotional well-being.  Home and relationship well-being.  Sexual activity.  Eating habits.  Work and work Statistician.  Method of birth control.  Menstrual cycle.  Pregnancy history.  Screening You may have the following tests or measurements:  Height, weight, and BMI.  Diabetes screening. This is done by checking your blood sugar (glucose) after you have not eaten for a while (fasting).  Blood pressure.  Lipid and cholesterol levels. These may be checked every 5 years starting at age 34.  Skin check.  Hepatitis C blood test.  Hepatitis B blood test.  Sexually transmitted disease  (STD) testing.  BRCA-related cancer screening. This may be done if you have a family history of breast, ovarian, tubal, or peritoneal cancers.  Pelvic exam and Pap test. This may be done every 3 years starting at age 53. Starting at age 56, this may be done every 5 years if you have a Pap test in combination with an HPV test.  Discuss your test results, treatment options, and if necessary, the need for more tests with your health care provider. Vaccines Your health care provider may recommend certain vaccines, such as:  Influenza vaccine. This is recommended every year.  Tetanus, diphtheria, and acellular pertussis (Tdap, Td) vaccine. You may need a Td booster every 10 years.  Varicella vaccine. You may need this if you have not been vaccinated.  HPV vaccine. If you are 76 or younger, you may need three doses over 6 months.  Measles, mumps, and rubella (MMR) vaccine. You may need at least one dose of MMR. You may also need a second dose.  Pneumococcal 13-valent conjugate (PCV13) vaccine. You may need this if you have certain conditions and were not previously vaccinated.  Pneumococcal polysaccharide (PPSV23) vaccine. You may need one or two doses if you smoke cigarettes or if you have certain conditions.  Meningococcal vaccine. One dose is recommended if you are age 53-21 years and a first-year college student living in a residence hall, or if you have one of several medical conditions. You may also need additional booster doses.  Hepatitis A vaccine. You may need this if you have certain conditions or if you travel or work in places where you may be exposed to hepatitis A.  Hepatitis B vaccine. You may need this if you have certain conditions or if you travel or work in places where you may be exposed to hepatitis B.  Haemophilus influenzae type b (Hib) vaccine. You may need this if you have certain risk factors.  Talk to your health care provider about which screenings and vaccines  you need and how often you need them. This information is not intended to replace advice given to you by your health care provider. Make sure you discuss any questions you have with your health care provider. Document Released: 04/22/2001 Document Revised: 11/14/2015 Document Reviewed: 12/26/2014 Elsevier Interactive Patient Education  [employer].

## 2017-07-09 ENCOUNTER — Ambulatory Visit (INDEPENDENT_AMBULATORY_CARE_PROVIDER_SITE_OTHER): Payer: BLUE CROSS/BLUE SHIELD | Admitting: Certified Nurse Midwife

## 2017-07-09 DIAGNOSIS — N898 Other specified noninflammatory disorders of vagina: Secondary | ICD-10-CM

## 2017-07-09 MED ORDER — ETONOGESTREL-ETHINYL ESTRADIOL 0.12-0.015 MG/24HR VA RING: VAGINAL_RING | VAGINAL | 12 refills | Status: DC

## 2017-07-09 NOTE — Progress Notes (Signed)
Subjective:    Carly Henry is a 22 y.o. G40P1001 Hispanic female who presents for a postpartum visit. She is 6 weeks postpartum following a primary cesarean section, low transverse incision and cesarean indication: failure to progress: arrest of dilation and non-reassuring fetal status at 41+1 gestational weeks. Anesthesia: spinal. I have fully reviewed the prenatal and intrapartum course.  Postpartum course has been uncomplicated. Baby's course has been uncomplicated. Baby is feeding by formula. Bleeding no bleeding. Bowel function is normal. Bladder function is normal.   Patient is sexually active. Contraception method is coitus interruptus. Postpartum depression screening: negative. Score 4.  Patient is due for Pap smear.   Reports persistent vaginal discharge and odor despite home treatment measures.   Denies difficulty breathing or respiratory distress, chest pain, abdominal pain, excessive vaginal bleeding, dysuria, and leg pain or swelling.   The following portions of the patient's history were reviewed and updated as appropriate: allergies, current medications, past medical history, past surgical history and problem list.  Review of Systems  Pertinent items are noted in HPI.   Objective:   BP (!) 112/59   Pulse 79   Ht  (1.575 m)   Wt 185 lb 9 oz (84.2 kg)   Breastfeeding? No   BMI 33.94 kg/m   General:  alert, cooperative and no distress   Breasts:  deferred, no complaints  Lungs: clear to auscultation bilaterally  Heart:  regular rate and rhythm  Abdomen: soft, nontender   Vulva: normal  Vagina: normal vagina  Cervix:  closed  Corpus: Well-involuted  Adnexa:  Non-palpable          Office Visit from 07/09/2017 in [facility]  PHQ-9 Total Score  4     Assessment:   Postpartum exam Six (6) wks s/p primary c-section Formula feeding Depression screening Contraception counseling   Plan:   May return to work without restriction.    NuSwab collected, will contact patient with results.   Rx: NuvaRing, see orders.   Reviewed red flag symptoms and when to call.   Follow up in: 6 months for Annual Exam or earlier if needed   Gunnar Bulla, CNM [facility], [facility]

## 2017-07-09 NOTE — Patient Instructions (Signed)
Preventive Care 18-39 Years, Female Preventive care refers to lifestyle choices and visits with your health care provider that can promote health and wellness. What does preventive care include?  A yearly physical exam. This is also called an annual well check.  Dental exams once or twice a year.  Routine eye exams. Ask your health care provider how often you should have your eyes checked.  Personal lifestyle choices, including: ? Daily care of your teeth and gums. ? Regular physical activity. ? Eating a healthy diet. ? Avoiding tobacco and drug use. ? Limiting alcohol use. ? Practicing safe sex. ? Taking vitamin and mineral supplements as recommended by your health care provider. What happens during an annual well check? The services and screenings done by your health care provider during your annual well check will depend on your age, overall health, lifestyle risk factors, and family history of disease. Counseling Your health care provider may ask you questions about your:  Alcohol use.  Tobacco use.  Drug use.  Emotional well-being.  Home and relationship well-being.  Sexual activity.  Eating habits.  Work and work Statistician.  Method of birth control.  Menstrual cycle.  Pregnancy history.  Screening You may have the following tests or measurements:  Height, weight, and BMI.  Diabetes screening. This is done by checking your blood sugar (glucose) after you have not eaten for a while (fasting).  Blood pressure.  Lipid and cholesterol levels. These may be checked every 5 years starting at age 66.  Skin check.  Hepatitis C blood test.  Hepatitis B blood test.  Sexually transmitted disease (STD) testing.  BRCA-related cancer screening. This may be done if you have a family history of breast, ovarian, tubal, or peritoneal cancers.  Pelvic exam and Pap test. This may be done every 3 years starting at age 40. Starting at age 59, this may be done every 5  years if you have a Pap test in combination with an HPV test.  Discuss your test results, treatment options, and if necessary, the need for more tests with your health care provider. Vaccines Your health care provider may recommend certain vaccines, such as:  Influenza vaccine. This is recommended every year.  Tetanus, diphtheria, and acellular pertussis (Tdap, Td) vaccine. You may need a Td booster every 10 years.  Varicella vaccine. You may need this if you have not been vaccinated.  HPV vaccine. If you are 69 or younger, you may need three doses over 6 months.  Measles, mumps, and rubella (MMR) vaccine. You may need at least one dose of MMR. You may also need a second dose.  Pneumococcal 13-valent conjugate (PCV13) vaccine. You may need this if you have certain conditions and were not previously vaccinated.  Pneumococcal polysaccharide (PPSV23) vaccine. You may need one or two doses if you smoke cigarettes or if you have certain conditions.  Meningococcal vaccine. One dose is recommended if you are age 27-21 years and a first-year college student living in a residence hall, or if you have one of several medical conditions. You may also need additional booster doses.  Hepatitis A vaccine. You may need this if you have certain conditions or if you travel or work in places where you may be exposed to hepatitis A.  Hepatitis B vaccine. You may need this if you have certain conditions or if you travel or work in places where you may be exposed to hepatitis B.  Haemophilus influenzae type b (Hib) vaccine. You may need this if  you have certain risk factors.  Talk to your health care provider about which screenings and vaccines you need and how often you need them. This information is not intended to replace advice given to you by your health care provider. Make sure you discuss any questions you have with your health care provider. Document Released: 04/22/2001 Document Revised: 11/14/2015  Document Reviewed: 12/26/2014 Elsevier Interactive Patient Education  [employer].

## 2017-07-09 NOTE — Progress Notes (Signed)
Pt is here for a post partum visit. Bottle feeding. Has not had a period. Is interested in NuvaRing. Has resumed intercourse with no issues.Screening 4

## 2017-07-13 ENCOUNTER — Other Ambulatory Visit: Payer: Self-pay

## 2017-07-13 LAB — NUSWAB VAGINITIS (VG)
ATOPOBIUM VAGINAE: HIGH {score} — AB
BVAB 2: HIGH {score} — AB
CANDIDA ALBICANS, NAA: NEGATIVE
Candida glabrata, NAA: NEGATIVE
TRICH VAG BY NAA: NEGATIVE

## 2017-07-13 MED ORDER — METRONIDAZOLE 500 MG PO TABS: 500.0000 mg | ORAL_TABLET | Freq: Two times a day (BID) | ORAL | 0 refills | Status: DC

## 2017-07-13 NOTE — Progress Notes (Signed)
NuSwab positive for Bv. May have Flagyl, Metrogel, or Solosec per standard treatment protocol as treatment. Thanks, JML

## 2017-07-13 NOTE — Telephone Encounter (Signed)
Pt informed. Prescription escribed to [facility] per pt request.

## 2017-07-16 ENCOUNTER — Telehealth: Payer: Self-pay | Admitting: Certified Nurse Midwife

## 2017-07-16 NOTE — Telephone Encounter (Signed)
The patient called and stated that she would like to speak with a nurse in regards to her wanting to know if she can work out at [employer] Please advise.

## 2017-07-17 ENCOUNTER — Telehealth: Payer: Self-pay

## 2017-07-17 NOTE — Telephone Encounter (Signed)
Pt had her post partum visit and was cleared by provider. Advised to ease into exercise routine and to stop if it becomes uncomfortable. Pt understood.

## 2018-01-21 ENCOUNTER — Encounter: Payer: BLUE CROSS/BLUE SHIELD | Admitting: Certified Nurse Midwife

## 2018-06-28 ENCOUNTER — Other Ambulatory Visit: Payer: Self-pay

## 2018-06-28 ENCOUNTER — Telehealth: Payer: Self-pay

## 2018-06-28 MED ORDER — ETONOGESTREL-ETHINYL ESTRADIOL 0.12-0.015 MG/24HR VA RING: VAGINAL_RING | VAGINAL | 1 refills | Status: DC

## 2018-06-28 NOTE — Telephone Encounter (Signed)
Hey this is mid wife patient.

## 2018-06-28 NOTE — Telephone Encounter (Signed)
Pt states pharmacy needs approval to fill her nuvaring.   Pharmacy- [facility].   pls advise

## 2018-07-21 ENCOUNTER — Telehealth: Payer: Self-pay | Admitting: Certified Nurse Midwife

## 2018-07-21 ENCOUNTER — Other Ambulatory Visit: Payer: Self-pay | Admitting: *Deleted

## 2018-07-21 MED ORDER — ETONOGESTREL-ETHINYL ESTRADIOL 0.12-0.015 MG/24HR VA RING: VAGINAL_RING | VAGINAL | 3 refills | Status: DC

## 2018-07-21 NOTE — Telephone Encounter (Signed)
Done-ac 

## 2018-07-21 NOTE — Telephone Encounter (Signed)
Patient called stating her insurance only pays for her birth control as a 3 month supply. She cant pick it up until its changed. Thanks

## 2018-08-18 IMAGING — US US OB TRANSVAGINAL
1 series · 13 of 28 positions shown · non-contrast
Comparison: None.

CLINICAL DATA: Lower abdominal pain for the past week. Seven weeks
and 0 days pregnant by last menstrual period.



[Series 1: us ob transvaginal · 0.12mm/px · 13 of 101 slices shown]
[im 4/101]
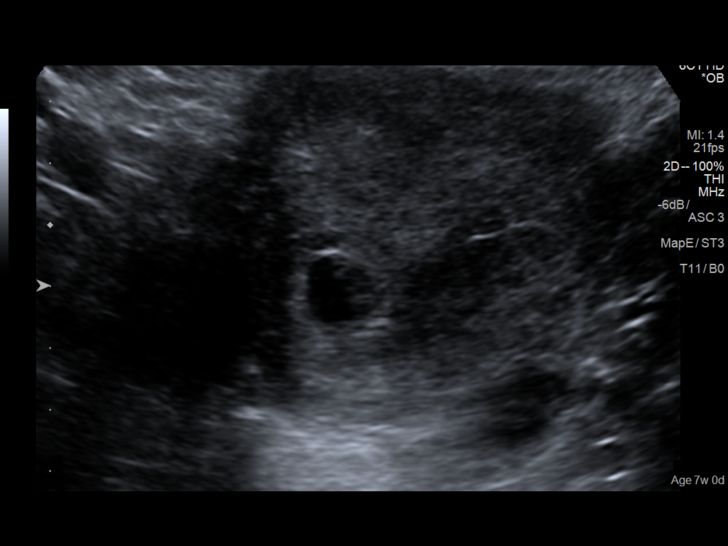
[im 12/101]
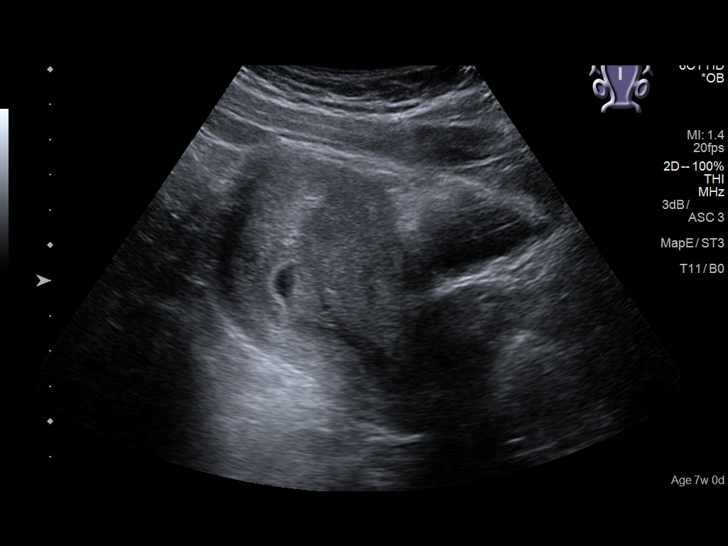
[im 19/101]
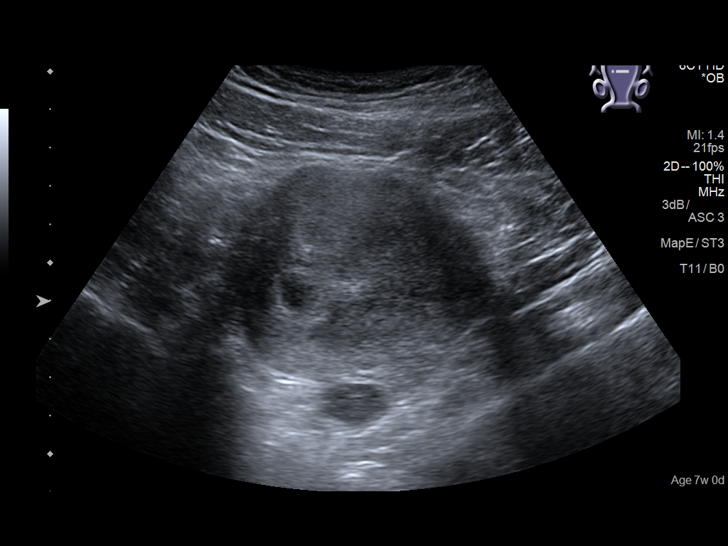
[im 26/101]
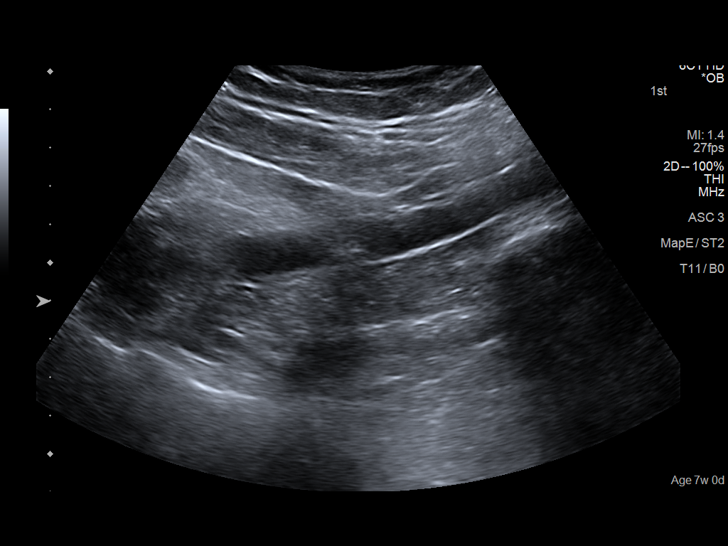
[im 34/101]
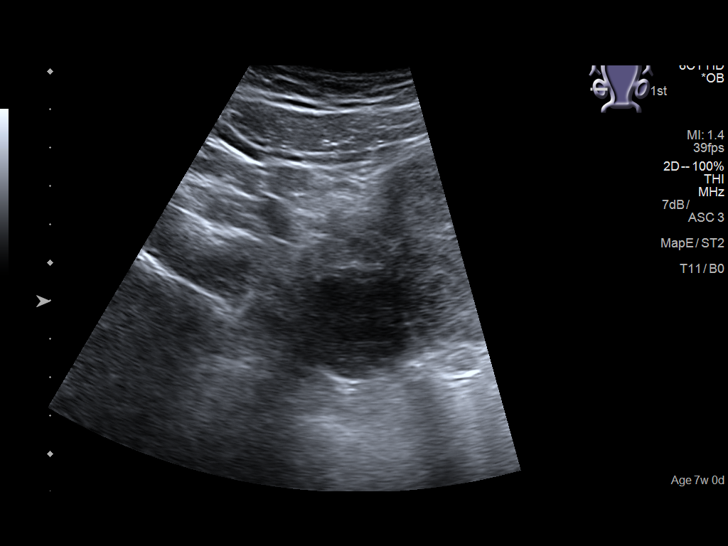
[im 41/101]
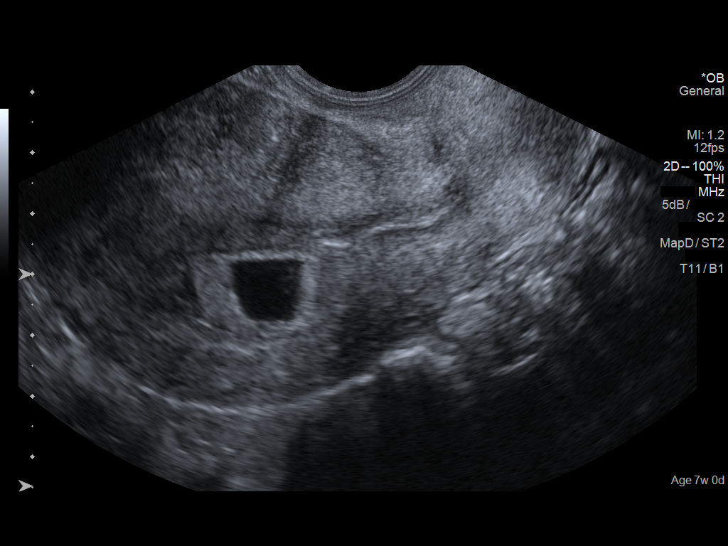
[im 52/101]
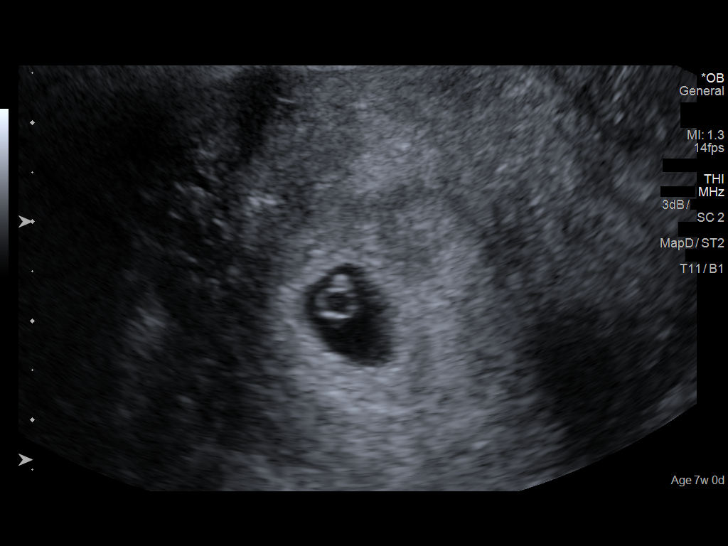
[im 60/101]
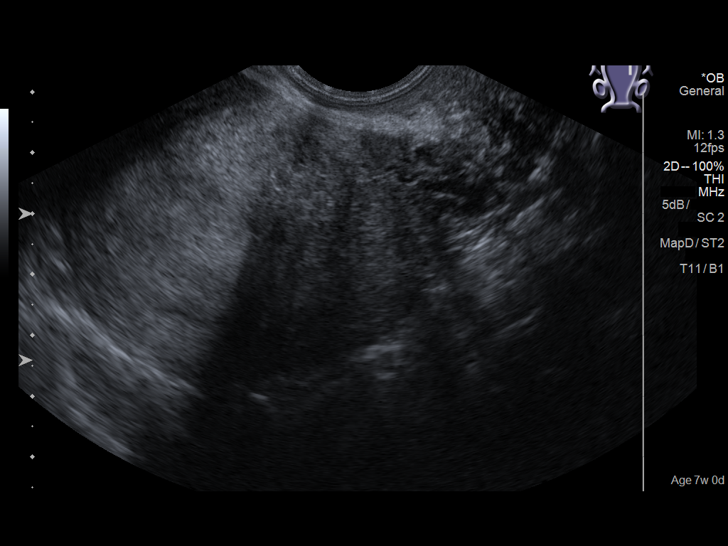
[im 67/101]
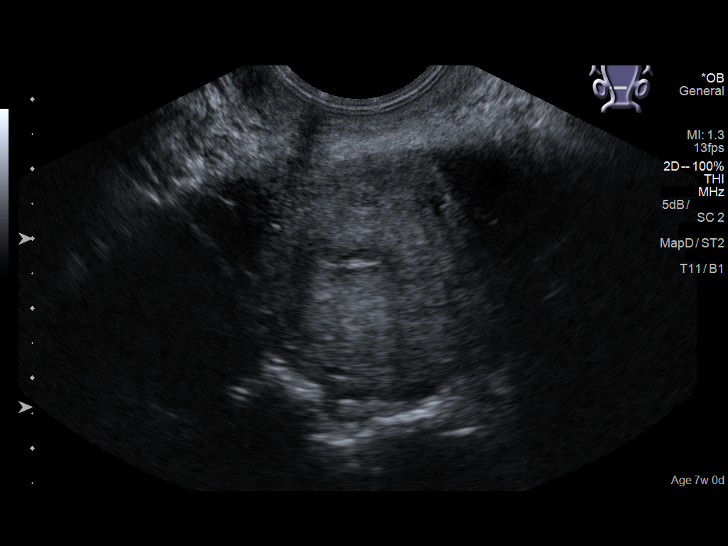
[im 75/101]
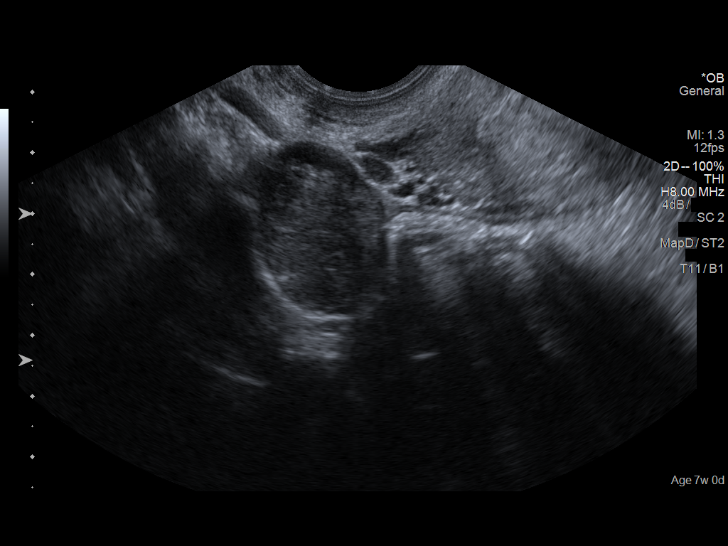
[im 82/101]
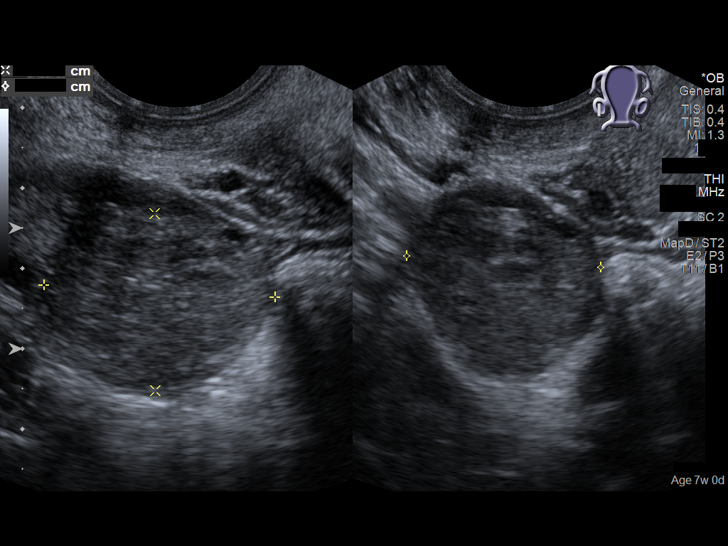
[im 89/101]
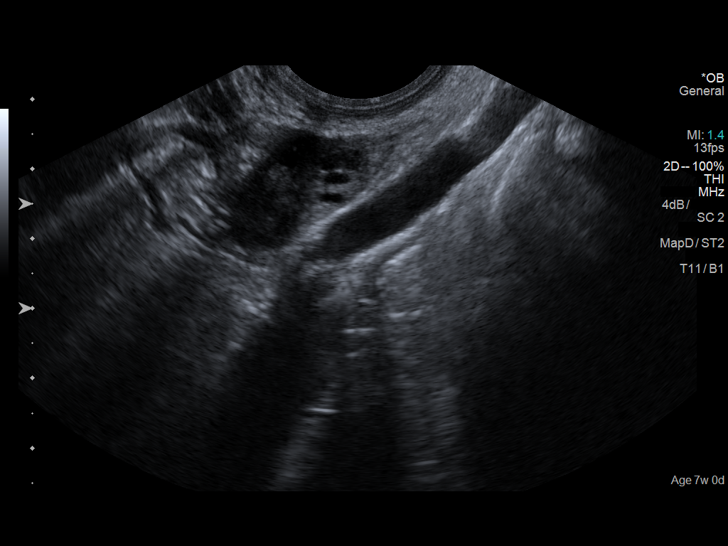
[im 97/101]
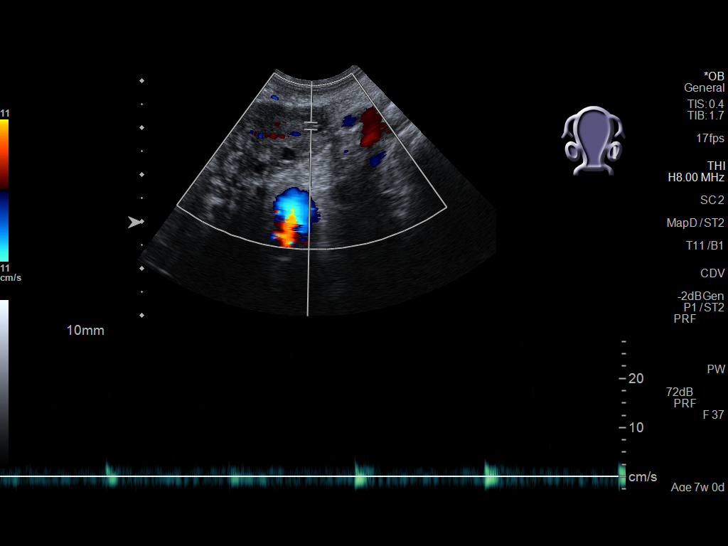

[13 of 28 positions shown; findings below may reference images not displayed]

FINDINGS: Intrauterine gestational sac: Visualize

Yolk sac:  Visualized

Embryo:  Visualized

Cardiac Activity: Visualized

Heart Rate: 81 bpm

CRL:   2.5  mm   5 w 6 d                  US EDC: 05/19/2017

Subchorionic hemorrhage:  Small

Maternal uterus/adnexae: Normal appearing maternal ovaries with a
low grade corpus luteum noted. No free peritoneal fluid.

Pulsed Doppler evaluation of both ovaries demonstrates normal
appearing low-resistance arterial and venous waveforms.
IMPRESSION: 1. Single live intrauterine gestation with an estimated gestational
age of 5 weeks and 6 days.
2. Small subchorionic hemorrhage.
3. No evidence of ovarian torsion.

## 2018-10-29 ENCOUNTER — Other Ambulatory Visit: Payer: Self-pay

## 2018-10-29 ENCOUNTER — Encounter: Payer: Self-pay | Admitting: Adult Health

## 2018-10-29 ENCOUNTER — Ambulatory Visit: Payer: Self-pay | Admitting: Adult Health

## 2018-10-29 DIAGNOSIS — Z20822 Contact with and (suspected) exposure to covid-19: Secondary | ICD-10-CM

## 2018-10-29 DIAGNOSIS — Z5320 Procedure and treatment not carried out because of patient's decision for unspecified reasons: Secondary | ICD-10-CM

## 2018-10-29 NOTE — Progress Notes (Signed)
Virtual Visit via Telephone Note  I connected with Carly Henry on 10/29/18 at  2:30 PM EDT by telephone and verified that I am speaking with the correct person using two identifiers.  Location: Patient: at home Provider: [facility], [place], [place]   I discussed the limitations, risks, security and privacy concerns of performing an evaluation and management service by telephone and the availability of in person appointments.  Patient declined telephone virtual visit as above( not able to see Covid patients in this office at this time )  and will go to [facility] now for a in person visit that she prefers. Face to Face visit was recommended now and to follow that providers recommendations and obtain a work note. She denies any distress. Patient was not accessed and spoke complete sentences 15 plus words without any pause or distress.  Patient verbalized understanding of all instructions given and denies any further questions at this time.   Marcille Buffy, FNP

## 2018-10-29 NOTE — Patient Instructions (Signed)
Go directly to [facility] for in person evaluation as you requested.

## 2018-10-30 LAB — NOVEL CORONAVIRUS, NAA: SARS-CoV-2, NAA: NOT DETECTED

## 2019-01-07 ENCOUNTER — Telehealth: Payer: Self-pay

## 2019-01-07 NOTE — Telephone Encounter (Signed)
05/2017 c-section delivery. Pt says right side of c/s scar is a lot of pain. Also pt states hemorrhoids bleeding so much they fill the toilet.  Call pt she desires work in [place].

## 2019-01-07 NOTE — Telephone Encounter (Signed)
Attempted to contact patient, no answer.  LMTRC. 

## 2019-01-07 NOTE — Telephone Encounter (Signed)
Patient returned call.  Spoke with patient, she c/o right sided internal pain at c-section incision site x3 days, no fever, no swelling, no redness, no VB, taking Ibuprofen with no relief.  Last BM was this morning.  Also c/o bleeding hemorrhoids for >1 year now.  Advised patient she may go to ER or we can schedule an appointment for Monday.  Patient prefers OV and OV scheduled.  Recommended warm compresses and continue taking Ibuprofen.  Patient verbalized understanding.

## 2019-01-10 ENCOUNTER — Other Ambulatory Visit: Payer: Self-pay

## 2019-01-10 ENCOUNTER — Ambulatory Visit (INDEPENDENT_AMBULATORY_CARE_PROVIDER_SITE_OTHER): Payer: Managed Care, Other (non HMO) | Admitting: Certified Nurse Midwife

## 2019-01-10 ENCOUNTER — Encounter: Payer: Self-pay | Admitting: *Deleted

## 2019-01-10 ENCOUNTER — Other Ambulatory Visit (HOSPITAL_COMMUNITY)
Admission: RE | Admit: 2019-01-10 | Discharge: 2019-01-10 | Disposition: A | Discharge status: Home / Self Care | Payer: Managed Care, Other (non HMO) | Source: Ambulatory Visit | Attending: Certified Nurse Midwife | Admitting: Certified Nurse Midwife

## 2019-01-10 ENCOUNTER — Encounter: Payer: Self-pay | Admitting: Certified Nurse Midwife

## 2019-01-10 VITALS — BP 105/62 | HR 55 | Ht 62.0 in | Wt 184.4 lb

## 2019-01-10 DIAGNOSIS — Z01419 Encounter for gynecological examination (general) (routine) without abnormal findings: Secondary | ICD-10-CM | POA: Diagnosis not present

## 2019-01-10 DIAGNOSIS — Z124 Encounter for screening for malignant neoplasm of cervix: Secondary | ICD-10-CM

## 2019-01-10 DIAGNOSIS — R102 Pelvic and perineal pain: Secondary | ICD-10-CM

## 2019-01-10 DIAGNOSIS — L7682 Other postprocedural complications of skin and subcutaneous tissue: Secondary | ICD-10-CM | POA: Insufficient documentation

## 2019-01-10 DIAGNOSIS — Z3044 Encounter for surveillance of vaginal ring hormonal contraceptive device: Secondary | ICD-10-CM

## 2019-01-10 DIAGNOSIS — K649 Unspecified hemorrhoids: Secondary | ICD-10-CM

## 2019-01-10 DIAGNOSIS — Z6833 Body mass index (BMI) 33.0-33.9, adult: Secondary | ICD-10-CM

## 2019-01-10 DIAGNOSIS — Z98891 History of uterine scar from previous surgery: Secondary | ICD-10-CM

## 2019-01-10 NOTE — Progress Notes (Signed)
Patient c/o intermittent rectal bleeding due to hemorrhoids x1 year.  Also c/o sharp pain on right side of c-section incision site, radiates to lower back and left leg goes numb x1 week.  Taking Tylenol or Advil with no relief.

## 2019-01-10 NOTE — Progress Notes (Signed)
ANNUAL PREVENTATIVE CARE GYN  ENCOUNTER NOTE  Subjective:       Carly Henry is a 23 y.o. G35P1001 female here for a routine annual gynecologic exam.  Current complaints: 1. Intermittent rectal bleeding due to hemorrhoids for the last year; has not attempted any home treatment measures  2. Intermittent sharp pain on right side of cesarean section incision site that radiates to her lower back and goes down her left leg for one (1) week; occur two (2) weeks before ovulation; no relief with home treatment measures  Denies difficulty breathing or respiratory distress, chest pain, dysuria, and leg pain or swelling.    Gynecologic History  Patient's last menstrual period was 12/28/2018 (approximate). Period Cycle (Days): 28 Period Duration (Days): 5 Period Pattern: Regular Menstrual Flow: Moderate Menstrual Control: Tampon, Thin pad Dysmenorrhea: (!) Severe Dysmenorrhea Symptoms: Cramping  Contraception: NuvaRing vaginal inserts  Last Pap: due.   Obstetric History  OB History  Gravida Para Term Preterm AB Living  1 1 1     1   SAB TAB Ectopic Multiple Live Births        0 1    # Outcome Date GA Lbr Len/2nd Weight Sex Delivery Anes PTL Lv  1 Term 05/28/17 [redacted]w[redacted]d  7 lb 0.9 oz (3.2 kg) F CS-LTranv Spinal  LIV     Complications: Failure to Progress in Second Stage    Past Medical History:  Diagnosis Date  . Migraine     Past Surgical History:  Procedure Laterality Date  . CESAREAN SECTION N/A 05/28/2017   Procedure: CESAREAN SECTION;  Surgeon: Rubie Maid, MD;  Location: [facility] ORS;  Service: Obstetrics;  Laterality: N/A;    Current Outpatient Medications on File Prior to Visit  Medication Sig Dispense Refill  . etonogestrel-ethinyl estradiol (NUVARING) 0.12-0.015 MG/24HR vaginal ring Insert vaginally and leave in place for 3 consecutive weeks, then remove for 1 week. 3 each 3   No current facility-administered medications on file prior to visit.     No Known  Allergies  Social History   Socioeconomic History  . Marital status: Married    Spouse name: Not on file  . Number of children: 0  . Years of education: 12+  . Highest education level: Not on file  Occupational History  . Occupation: Electronics engineer  Social Needs  . Financial resource strain: Not on file  . Food insecurity    Worry: Not on file    Inability: Not on file  . Transportation needs    Medical: Not on file    Non-medical: Not on file  Tobacco Use  . Smoking status: Never Smoker  . Smokeless tobacco: Never Used  Substance and Sexual Activity  . Alcohol use: Yes    Frequency: Never    Comment: Rare  . Drug use: No  . Sexual activity: Yes    Birth control/protection: Inserts    Comment: Nuvaring  Lifestyle  . Physical activity    Days per week: Not on file    Minutes per session: Not on file  . Stress: Not on file  Relationships  . Social Herbalist on phone: Not on file    Gets together: Not on file    Attends religious service: Not on file    Active member of club or organization: Not on file    Attends meetings of clubs or organizations: Not on file    Relationship status: Not on file  . Intimate partner violence  Fear of current or ex partner: Not on file    Emotionally abused: Not on file    Physically abused: Not on file    Forced sexual activity: Not on file  Other Topics Concern  . Not on file  Social History Narrative  . Not on file    Family History  Problem Relation Age of Onset  . Gestational diabetes Mother   . Migraines Mother   . High Cholesterol Mother   . High Cholesterol Father   . Diabetes Maternal Grandmother   . Diabetes Maternal Grandfather   . Breast cancer Neg Hx   . Ovarian cancer Neg Hx   . Colon cancer Neg Hx     The following portions of the patient's history were reviewed and updated as appropriate: allergies, current medications, past family history, past medical history, past social history, past  surgical history and problem list.  Review of Systems  ROS negative except as noted above. Information obtained from patient.    Objective:   BP 105/62   Pulse (!) 55   Ht 5\' 2"  (1.575 m)   Wt 184 lb 6.4 oz (83.6 kg)   LMP 12/28/2018 (Approximate)   Breastfeeding No   BMI 33.73 kg/m    CONSTITUTIONAL: Well-developed, well-nourished female in no acute distress.   PSYCHIATRIC: Normal mood and affect. Normal behavior.  Normal judgment and thought content.  NEUROLGIC: Alert and oriented to person, place, and time. Normal muscle tone coordination. No cranial nerve deficit noted.  HENT:  Normocephalic, atraumatic, External right and left ear normal.   EYES: Conjunctivae and EOM are normal. Pupils are equal and round.   NECK: Normal range of motion, supple, no masses.  Normal thyroid.   SKIN: Skin is warm and dry. No rash noted. Not diaphoretic. No erythema. No pallor. Professional tattoos present.   CARDIOVASCULAR: Normal heart rate noted, regular  rhythm, no murmur.  RESPIRATORY: Clear to auscultation bilaterally. Effort and breath sounds normal, no problems with respiration noted.  BREASTS: Symmetric in size. No masses, skin changes, nipple drainage, or lymphadenopathy.  ABDOMEN: Soft, normal bowel sounds, no distention noted.  No rebound or guarding. Obese. Tenderness with palpation to right lower side along cesarean section scar.   PELVIC:  External Genitalia: Normal  Vagina: Normal  Cervix: Normal, Pap collected  Uterus: Normal  Adnexa: Normal  RV: External hemorrhoids present    MUSCULOSKELETAL: Normal range of motion. No tenderness.  No cyanosis, clubbing, or edema.  2+ distal pulses.  LYMPHATIC: No Axillary, Supraclavicular, or Inguinal Adenopathy.  Assessment:   Annual gynecologic examination 23 y.o.   Contraception: NuvaRing vaginal inserts   Obesity 1   Problem List Items Addressed This Visit    None    Visit Diagnoses    Well woman exam    -   Primary   Relevant Orders   Cytology - PAP   Ambulatory referral to Gastroenterology   Ambulatory referral to Physical Therapy   Screening for cervical cancer       Relevant Orders   Cytology - PAP   Incisional pain       Relevant Orders   Ambulatory referral to Physical Therapy   Bleeding hemorrhoid       Relevant Orders   Ambulatory referral to Gastroenterology   Encounter for surveillance of vaginal ring hormonal contraceptive device       BMI 33.0-33.9,adult       History of cesarean section       Relevant Orders  Ambulatory referral to Physical Therapy   Pelvic pain       Relevant Orders   Ambulatory referral to Physical Therapy      Plan:   Pap: Pap, Reflex if ASCUS  Labs: Declined   Routine preventative health maintenance measures emphasized: Exercise/Diet/Weight control, Tobacco Warnings, Alcohol/Substance use risks, Stress Management and Peer Pressure Issues; see AVS  Discussed possible reasons for pelvic or incisional pain including scar tissue, mittelschmerz, and ovarian cyst; agrees to return to office of ultrasound, see orders  Referral to GI and Pelvic Floor Physical Therapy, see orders  Reports remaining refills of NuvaRing; may refill until next year  Reviewed red flag symptoms and when to call  RTC x 1 year for ANNUAL EXAM or sooner if needed   Gunnar BullaJenkins Michelle Brazos Sandoval, CNM [facility], [facility] 01/10/19 11:53 AM

## 2019-01-10 NOTE — Patient Instructions (Addendum)
Ethinyl Estradiol; Etonogestrel vaginal ring What is this medicine? ETHINYL ESTRADIOL; ETONOGESTREL (ETH in il es tra DYE ole; et oh noe JES trel) vaginal ring is a flexible, vaginal ring used as a contraceptive (birth control method). This medicine combines 2 types of female hormones, an estrogen and a progestin. This ring is used to prevent ovulation and pregnancy. Each ring is effective for 1 month. This medicine may be used for other purposes; ask your health care provider or pharmacist if you have questions. COMMON BRAND NAME(S): EluRyng, NuvaRing What should I tell my health care provider before I take this medicine? They need to know if you have any of these conditions:  abnormal vaginal bleeding  blood vessel disease or blood clots  breast, cervical, endometrial, ovarian, liver, or uterine cancer  diabetes  gallbladder disease  having surgery  heart disease or recent heart attack  high blood pressure  high cholesterol or triglycerides  history of irregular heartbeat or heart valve problems  kidney disease  liver disease  migraine headaches  protein C deficiency  protein S deficiency  recently had a baby, miscarriage, or abortion  stroke  systemic lupus erythematosus (SLE)  tobacco smoker  your age is more than 24 years old  an unusual or allergic reaction to estrogens, progestins, other medicines, foods, dyes, or preservatives  pregnant or trying to get pregnant  breast-feeding How should I use this medicine? Insert the ring into your vagina as directed. Follow the directions on the prescription label. The ring will remain place for 3 weeks and is then removed for a 1-week break. A new ring is inserted 1 week after the last ring was removed, on the same day of the week. Check often to make sure the ring is still in place. If the ring was out of the vagina for an unknown amount of time, you may not be protected from pregnancy. Perform a pregnancy test and  call your doctor. Do not use more often than directed. A patient package insert for the product will be given with each prescription and refill. Read this sheet carefully each time. The sheet may change frequently. Contact your pediatrician regarding the use of this medicine in children. Special care may be needed. Overdosage: If you think you have taken too much of this medicine contact a poison control center or emergency room at once. NOTE: This medicine is only for you. Do not share this medicine with others. What if I miss a dose? You will need to use the ring exactly as directed. It is very important to follow the schedule every cycle. If you do not use the ring as directed, you may not be protected from pregnancy. If the ring should slip out, is lost, or if you leave it in longer or shorter than you should, contact your health care professional for advice. What may interact with this medicine? Do not take this medicine with the following medications:  dasabuvir; ombitasvir; paritaprevir; ritonavir  ombitasvir; paritaprevir; ritonavir  vaginal lubricants or other vaginal products that are oil-based or silicone-based This medicine may also interact with the following medications:  acetaminophen  antibiotics or medicines for infections, especially rifampin, rifabutin, rifapentine, and griseofulvin, and possibly penicillins or tetracyclines  aprepitant or fosaprepitant  armodafinil  ascorbic acid (vitamin C)  barbiturate medicines, such as phenobarbital or primidone  bosentan  certain antiviral medicines for hepatitis, HIV or AIDS  certain medicines for cancer treatment  certain medicines for seizures like carbamazepine, clobazam, felbamate, lamotrigine, oxcarbazepine, phenytoin,   rufinamide, topiramate  certain medicines for treating high cholesterol  cyclosporine  dantrolene  elagolix  flibanserin  grapefruit juice  lesinurad  medicines for diabetes  medicines  to treat fungal infections, such as griseofulvin, miconazole, fluconazole, ketoconazole, itraconazole, posaconazole or voriconazole  mifepristone  mitotane  modafinil  morphine  mycophenolate  St. John's wort  tamoxifen  temazepam  theophylline or aminophylline  thyroid hormones  tizanidine  tranexamic acid  ulipristal  warfarin This list may not describe all possible interactions. Give your health care provider a list of all the medicines, herbs, non-prescription drugs, or dietary supplements you use. Also tell them if you smoke, drink alcohol, or use illegal drugs. Some items may interact with your medicine. What should I watch for while using this medicine? Visit your doctor or health care professional for regular checks on your progress. You will need a regular breast and pelvic exam and Pap smear while on this medicine. Check with your doctor or health care professional to see if you need an additional method of contraception during the first cycle that you use this ring. Female condoms (made with natural rubber latex, polyisoprene, and polyurethane) and spermicides may be used. Do not use a diaphragm, cervical cap, or a female condom, as the ring can interfere with these birth control methods and their proper placement. If you have any reason to think you are pregnant, stop using this medicine right away and contact your doctor or health care professional. If you are using this medicine for hormone related problems, it may take several cycles of use to see improvement in your condition. Smoking increases the risk of getting a blood clot or having a stroke while you are using hormonal birth control, especially if you are more than 24 years old. You are strongly advised not to smoke. Some women are prone to getting dark patches on the skin of the face (cholasma). Your risk of getting chloasma with this medicine is higher if you had chloasma during a pregnancy. Keep out of the  sun. If you cannot avoid being in the sun, wear protective clothing and use sunscreen. Do not use sun lamps or tanning beds/booths. This medicine can make your body retain fluid, making your fingers, hands, or ankles swell. Your blood pressure can go up. Contact your doctor or health care professional if you feel you are retaining fluid. If you are going to have elective surgery, you may need to stop using this medicine before the surgery. Consult your health care professional for advice. This medicine does not protect you against HIV infection (AIDS) or any other sexually transmitted diseases. What side effects may I notice from receiving this medicine? Side effects that you should report to your doctor or health care professional as soon as possible:  allergic reactions such as skin rash or itching, hives, swelling of the lips, mouth, tongue, or throat  depression  high blood pressure  migraines or severe, sudden headaches  signs and symptoms of a blood clot such as breathing problems; changes in vision; chest pain; severe, sudden headache; pain, swelling, warmth in the leg; trouble speaking; sudden numbness or weakness of the face, arm or leg  signs and symptoms of infection like fever or chills with dizziness and a sunburn-like rash, or pain or trouble passing urine  stomach pain  symptoms of vaginal infection like itching, irritation or unusual discharge  yellowing of the eyes or skin Side effects that usually do not require medical attention (report these to your doctor  or health care professional if they continue or are bothersome):  acne  breast pain, tenderness  irregular vaginal bleeding or spotting, particularly during the first month of use  mild headache  nausea  painful periods  vomiting This list may not describe all possible side effects. Call your doctor for medical advice about side effects. You may report side effects to FDA at 1-800-FDA-1088. Where should I  keep my medicine? Keep out of the reach of children. Store unopened rings in the original foil pouch at room temperature between 20 and 25 degrees C (68 and 77 degrees F) for up to 4 months. Protect from light. Do not store above 30 degrees C (86 degrees F). Throw away any unused medicine after the expiration date. A ring may only be used for 1 cycle (1 month). After the 3-week cycle, a used ring is removed and should be placed in the re-closable foil pouch and discarded in the trash out of reach of children and pets. Do NOT flush down the toilet. NOTE: This sheet is a summary. It may not cover all possible information. If you have questions about this medicine, talk to your doctor, pharmacist, or health care provider.  2020 Elsevier/Gold Standard (2016-10-24 14:41:10) Hemorrhoids Hemorrhoids are swollen veins that may develop:  In the butt (rectum). These are called internal hemorrhoids.  Around the opening of the butt (anus). These are called external hemorrhoids. Hemorrhoids can cause pain, itching, or bleeding. Most of the time, they do not cause serious problems. They usually get better with diet changes, lifestyle changes, and other home treatments. What are the causes? This condition may be caused by:  Having trouble pooping (constipation).  Pushing hard (straining) to poop.  Watery poop (diarrhea).  Pregnancy.  Being very overweight (obese).  Sitting for long periods of time.  Heavy lifting or other activity that causes you to strain.  Anal sex.  Riding a bike for a long period of time. What are the signs or symptoms? Symptoms of this condition include:  Pain.  Itching or soreness in the butt.  Bleeding from the butt.  Leaking poop.  Swelling in the area.  One or more lumps around the opening of your butt. How is this diagnosed? A doctor can often diagnose this condition by looking at the affected area. The doctor may also:  Do an exam that involves feeling  the area with a gloved hand (digital rectal exam).  Examine the area inside your butt using a small tube (anoscope).  Order blood tests. This may be done if you have lost a lot of blood.  Have you get a test that involves looking inside the colon using a flexible tube with a camera on the end (sigmoidoscopy or colonoscopy). How is this treated? This condition can usually be treated at home. Your doctor may tell you to change what you eat, make lifestyle changes, or try home treatments. If these do not help, procedures can be done to remove the hemorrhoids or make them smaller. These may involve:  Placing rubber bands at the base of the hemorrhoids to cut off their blood supply.  Injecting medicine into the hemorrhoids to shrink them.  Shining a type of light energy onto the hemorrhoids to cause them to fall off.  Doing surgery to remove the hemorrhoids or cut off their blood supply. Follow these instructions at home: Eating and drinking   Eat foods that have a lot of fiber in them. These include whole grains, beans, nuts, fruits,   and vegetables.  Ask your doctor about taking products that have added fiber (fibersupplements).  Reduce the amount of fat in your diet. You can do this by: ? Eating low-fat dairy products. ? Eating less red meat. ? Avoiding processed foods.  Drink enough fluid to keep your pee (urine) pale yellow. Managing pain and swelling   Take a warm-water bath (sitz bath) for 20 minutes to ease pain. Do this 3-4 times a day. You may do this in a bathtub or using a portable sitz bath that fits over the toilet.  If told, put ice on the painful area. It may be helpful to use ice between your warm baths. ? Put ice in a plastic bag. ? Place a towel between your skin and the bag. ? Leave the ice on for 20 minutes, 2-3 times a day. General instructions  Take over-the-counter and prescription medicines only as told by your doctor. ? Medicated creams and medicines may  be used as told.  Exercise often. Ask your doctor how much and what kind of exercise is best for you.  Go to the bathroom when you have the urge to poop. Do not wait.  Avoid pushing too hard when you poop.  Keep your butt dry and clean. Use wet toilet paper or moist towelettes after pooping.  Do not sit on the toilet for a long time.  Keep all follow-up visits as told by your doctor. This is important. Contact a doctor if you:  Have pain and swelling that do not get better with treatment or medicine.  Have trouble pooping.  Cannot poop.  Have pain or swelling outside the area of the hemorrhoids. Get help right away if you have:  Bleeding that will not stop. Summary  Hemorrhoids are swollen veins in the butt or around the opening of the butt.  They can cause pain, itching, or bleeding.  Eat foods that have a lot of fiber in them. These include whole grains, beans, nuts, fruits, and vegetables.  Take a warm-water bath (sitz bath) for 20 minutes to ease pain. Do this 3-4 times a day. This information is not intended to replace advice given to you by your health care provider. Make sure you discuss any questions you have with your health care provider. Document Released: 12/04/2007 Document Revised: 03/04/2018 Document Reviewed: 07/16/2017 Elsevier Patient Education  2020 [employer] Preventive Care 21-39 Years Old, Female Preventive care refers to visits with your health care provider and lifestyle choices that can promote health and wellness. This includes:  A yearly physical exam. This may also be called an annual well check.  Regular dental visits and eye exams.  Immunizations.  Screening for certain conditions.  Healthy lifestyle choices, such as eating a healthy diet, getting regular exercise, not using drugs or products that contain nicotine and tobacco, and limiting alcohol use. What can I expect for my preventive care visit? Physical exam Your health care  provider will check your:  Height and weight. This may be used to calculate body mass index (BMI), which tells if you are at a healthy weight.  Heart rate and blood pressure.  Skin for abnormal spots. Counseling Your health care provider may ask you questions about your:  Alcohol, tobacco, and drug use.  Emotional well-being.  Home and relationship well-being.  Sexual activity.  Eating habits.  Work and work environment.  Method of birth control.  Menstrual cycle.  Pregnancy history. What immunizations do I need?  Influenza (flu) vaccine  This   is recommended every year. Tetanus, diphtheria, and pertussis (Tdap) vaccine  You may need a Td booster every 10 years. Varicella (chickenpox) vaccine  You may need this if you have not been vaccinated. Human papillomavirus (HPV) vaccine  If recommended by your health care provider, you may need three doses over 6 months. Measles, mumps, and rubella (MMR) vaccine  You may need at least one dose of MMR. You may also need a second dose. Meningococcal conjugate (MenACWY) vaccine  One dose is recommended if you are age 59-21 years and a first-year college student living in a residence hall, or if you have one of several medical conditions. You may also need additional booster doses. Pneumococcal conjugate (PCV13) vaccine  You may need this if you have certain conditions and were not previously vaccinated. Pneumococcal polysaccharide (PPSV23) vaccine  You may need one or two doses if you smoke cigarettes or if you have certain conditions. Hepatitis A vaccine  You may need this if you have certain conditions or if you travel or work in places where you may be exposed to hepatitis A. Hepatitis B vaccine  You may need this if you have certain conditions or if you travel or work in places where you may be exposed to hepatitis B. Haemophilus influenzae type b (Hib) vaccine  You may need this if you have certain conditions.  You may receive vaccines as individual doses or as more than one vaccine together in one shot (combination vaccines). Talk with your health care provider about the risks and benefits of combination vaccines. What tests do I need?  Blood tests  Lipid and cholesterol levels. These may be checked every 5 years starting at age 65.  Hepatitis C test.  Hepatitis B test. Screening  Diabetes screening. This is done by checking your blood sugar (glucose) after you have not eaten for a while (fasting).  Sexually transmitted disease (STD) testing.  BRCA-related cancer screening. This may be done if you have a family history of breast, ovarian, tubal, or peritoneal cancers.  Pelvic exam and Pap test. This may be done every 3 years starting at age 27. Starting at age 3, this may be done every 5 years if you have a Pap test in combination with an HPV test. Talk with your health care provider about your test results, treatment options, and if necessary, the need for more tests. Follow these instructions at home: Eating and drinking   Eat a diet that includes fresh fruits and vegetables, whole grains, lean protein, and low-fat dairy.  Take vitamin and mineral supplements as recommended by your health care provider.  Do not drink alcohol if: ? Your health care provider tells you not to drink. ? You are pregnant, may be pregnant, or are planning to become pregnant.  If you drink alcohol: ? Limit how much you have to 0-1 drink a day. ? Be aware of how much alcohol is in your drink. In the [place] one drink equals one 12 oz bottle of beer (355 mL), one 5 oz glass of wine (148 mL), or one 1 oz glass of hard liquor (44 mL). Lifestyle  Take daily care of your teeth and gums.  Stay active. Exercise for at least 30 minutes on 5 or more days each week.  Do not use any products that contain nicotine or tobacco, such as cigarettes, e-cigarettes, and chewing tobacco. If you need help quitting, ask your  health care provider.  If you are sexually active, practice safe sex. Use  a condom or other form of birth control (contraception) in order to prevent pregnancy and STIs (sexually transmitted infections). If you plan to become pregnant, see your health care provider for a preconception visit. What's next?  Visit your health care provider once a year for a well check visit.  Ask your health care provider how often you should have your eyes and teeth checked.  Stay up to date on all vaccines. This information is not intended to replace advice given to you by your health care provider. Make sure you discuss any questions you have with your health care provider. Document Released: 04/22/2001 Document Revised: 11/05/2017 Document Reviewed: 11/05/2017 Elsevier Patient Education  2020 [place] is pain in the belly (abdomen) that affects women. The pain happens between periods. The pain:  Affects one side of the belly. The side may change from month to month.  May be mild or very bad.  May last from minutes to hours. It does not last longer than 1-2 days.  Can happen with a feeling of being sick to your stomach (nausea).  Can happen with a small amount of bleeding from your vagina. [place]merz is caused by the growth and release of an egg from an ovary. It is a natural part of the ovulation cycle. It often happens about 2 weeks after a woman's period ends. Follow these instructions at home: Watch for any changes in your condition. Let your doctor know about them. Take these actions to help with your pain:  Try soaking in a hot bath.  Try a heating pad to relax the muscles in the belly.  Take over-the-counter and prescription medicines only as told by your doctor.  Keep all follow-up visits as told by your doctor. This is important. Contact a doctor if:  The pain is very bad most months.  The pain lasts longer than 24 hours.  Your pain medicine  is not helping.  You have a fever.  You feel sick to your stomach, and the feeling does not go away.  You keep throwing up (vomiting).  You miss your period.  You have more than a small amount of blood coming from your vagina between periods. Summary  Mittelschmerz is a condition that affects women. It is pain in the belly that happens between periods.  This condition is caused by the growth and release of an egg from an ovary.  The pain may affect one side of the belly, may be mild or very bad, or may cause you to feel sick to your stomach.  To help with your pain, soak in a hot bath, use a heating pad, or take medicines.  Watch for any changes in your condition. Know when to contact a doctor for help. This information is not intended to replace advice given to you by your health care provider. Make sure you discuss any questions you have with your health care provider. Document Released: 04/03/2004 Document Revised: 09/08/2017 Document Reviewed: 09/08/2017 Elsevier Patient Education  2020 [employer].

## 2019-01-13 LAB — CYTOLOGY - PAP
Adequacy: ABSENT
Diagnosis: NEGATIVE

## 2019-01-18 ENCOUNTER — Other Ambulatory Visit: Payer: Self-pay | Admitting: Certified Nurse Midwife

## 2019-01-18 DIAGNOSIS — R52 Pain, unspecified: Secondary | ICD-10-CM

## 2019-01-20 ENCOUNTER — Encounter: Payer: BC Managed Care – PPO | Admitting: Certified Nurse Midwife

## 2019-01-20 ENCOUNTER — Other Ambulatory Visit: Payer: BC Managed Care – PPO

## 2019-03-14 ENCOUNTER — Ambulatory Visit: Payer: Managed Care, Other (non HMO) | Admitting: Gastroenterology

## 2019-04-18 ENCOUNTER — Telehealth: Payer: Self-pay

## 2019-04-18 NOTE — Telephone Encounter (Signed)
Called and spoke with patient.  Patient request to be seen before Cheri Guppy return.  Patient states she has seen Doreene Burke in the past and request to be scheduled with her.  Patient c/o what started off as a pimple on her left breast 1 month ago.  Patient states "I messed with it and was able to get puss out of it but that was weeks ago", area now looks bigger, black and bruised.  Patient also c/o hair loss "that is worrying me".  Appointment scheduled for 04/19/2019 with A. Janee Morn and patient verbalized understanding.

## 2019-04-18 NOTE — Telephone Encounter (Signed)
Patient called saying that she is experiencing a black bump on her breast. 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 Could you please call this patient back.  Thank you, Ardelle Lesches

## 2019-04-19 ENCOUNTER — Ambulatory Visit (INDEPENDENT_AMBULATORY_CARE_PROVIDER_SITE_OTHER): Payer: Managed Care, Other (non HMO) | Admitting: Certified Nurse Midwife

## 2019-04-19 ENCOUNTER — Encounter: Payer: Self-pay | Admitting: Certified Nurse Midwife

## 2019-04-19 ENCOUNTER — Other Ambulatory Visit: Payer: Self-pay

## 2019-04-19 VITALS — BP 100/60 | HR 64 | Ht 62.0 in | Wt 176.2 lb

## 2019-04-19 DIAGNOSIS — L659 Nonscarring hair loss, unspecified: Secondary | ICD-10-CM | POA: Diagnosis not present

## 2019-04-19 NOTE — Patient Instructions (Signed)
Alopecia Areata, Adult  Alopecia areata is a condition that causes you to lose hair. You may lose hair on your scalp in patches. In some cases, you may lose all the hair on your scalp (alopecia totalis) or all the hair from your face and body (alopecia universalis). Alopecia areata is an autoimmune disease. This means that your body's defense system (immune system) mistakes normal parts of the body for germs or other things that can make you sick. When you have alopecia areata, the immune system attacks the hair follicles. Alopecia areata usually develops in childhood, but it can develop at any age. For some people, their hair grows back on its own and hair loss does not happen again. For others, their hair may fall out and grow back in cycles. The hair loss may last many years. Having this condition can be emotionally difficult, but it is not dangerous. What are the causes? The cause of this condition is not known. What increases the risk? This condition is more likely to develop in people who have:  A family history of alopecia.  A family history of another autoimmune disease, including type 1 diabetes and rheumatoid arthritis.  Asthma and allergies.  Down syndrome. What are the signs or symptoms? Round spots of patchy hair loss on the scalp is the main symptom of this condition. The spots may be mildly itchy. Other symptoms include:  Short dark hairs in the bald patches that are wider at the top (exclamation point hairs).  Dents, white spots, or lines in the fingernails or toenails.  Balding and body hair loss. This is rare. How is this diagnosed? This condition is diagnosed based on your symptoms and family history. Your health care provider will also check your scalp skin, teeth, and nails. Your health care provider may refer you to a specialist in hair and skin disorders (dermatologist). You may also have tests, including:  A hair pull test.  Blood tests or other screening tests  to check for autoimmune diseases, such as thyroid disease or diabetes.  Skin biopsy to confirm the diagnosis.  A procedure to examine the skin with a lighted magnifying instrument (dermoscopy). How is this treated? There is no cure for alopecia areata. Treatment is aimed at promoting the regrowth of hair and preventing the immune system from overreacting. No single treatment is right for all people with alopecia areata. It depends on the type of hair loss you have and how severe it is. Work with your health care provider to find the best treatment for you. Treatment may include:  Having regular checkups to make sure the condition is not getting worse (watchful waiting).  Steroid creams or pills for 6-8 weeks to stop the immune reaction and help hair to regrow more quickly.  Other topical medicines to alter the immune system response and support the hair growth cycle.  Steroid injections.  Therapy and counseling with a support group or therapist if you are having trouble coping with hair loss. Follow these instructions at home:  Learn as much as you can about your condition.  Apply topical creams only as told by your health care provider.  Take over-the-counter and prescription medicines only as told by your health care provider.  Consider getting a wig or products to make hair look fuller or to cover bald spots, if you feel uncomfortable with your appearance.  Get therapy or counseling if you are having a hard time coping with hair loss. Ask your health care provider to recommend   a counselor or support group.  Keep all follow-up visits as told by your health care provider. This is important. Contact a health care provider if:  Your hair loss gets worse, even with treatment.  You have new symptoms.  You are struggling emotionally. Summary  Alopecia areata is an autoimmune condition that makes your body's defense system (immune system) attack the hair follicles. This causes you  to lose hair.  Treatments may include regular checkups to make sure that the condition is not getting worse (watchful waiting), medicines, and steroid injections. This information is not intended to replace advice given to you by your health care provider. Make sure you discuss any questions you have with your health care provider. Document Revised: 02/06/2017 Document Reviewed: 03/14/2016 Elsevier Patient Education  2020 Elsevier Inc.  

## 2019-04-19 NOTE — Progress Notes (Signed)
GYN ENCOUNTER NOTE  Subjective:       Carly Henry is a 24 y.o. G27P1001 female is here for gynecologic evaluation of the following issues:  1. Hair loss and bump on her left breast.     Gynecologic History No LMP recorded. Contraception: NuvaRing vaginal inserts Last Pap: 01/10/19. Results were: normal Last mammogram: n/a .   Obstetric History OB History  Gravida Para Term Preterm AB Living  1 1 1     1   SAB TAB Ectopic Multiple Live Births        0 1    # Outcome Date GA Lbr Len/2nd Weight Sex Delivery Anes PTL Lv  1 Term 05/28/17 [redacted]w[redacted]d  7 lb 0.9 oz (3.2 kg) F CS-LTranv Spinal  LIV     Complications: Failure to Progress in Second Stage    Past Medical History:  Diagnosis Date  . Migraine     Past Surgical History:  Procedure Laterality Date  . CESAREAN SECTION N/A 05/28/2017   Procedure: CESAREAN SECTION;  Surgeon: 05/30/2017, MD;  Location: [facility] ORS;  Service: Obstetrics;  Laterality: N/A;    Current Outpatient Medications on File Prior to Visit  Medication Sig Dispense Refill  . etonogestrel-ethinyl estradiol (NUVARING) 0.12-0.015 MG/24HR vaginal ring Insert vaginally and leave in place for 3 consecutive weeks, then remove for 1 week. 3 each 3   No current facility-administered medications on file prior to visit.    No Known Allergies  Social History   Socioeconomic History  . Marital status: Married    Spouse name: Not on file  . Number of children: 0  . Years of education: 12+  . Highest education level: Not on file  Occupational History  . Occupation: 04-05-1973  Tobacco Use  . Smoking status: Never Smoker  . Smokeless tobacco: Never Used  Substance and Sexual Activity  . Alcohol use: Yes    Comment: Rare  . Drug use: No  . Sexual activity: Yes    Birth control/protection: Inserts    Comment: Nuvaring  Other Topics Concern  . Not on file  Social History Narrative  . Not on file   Social Determinants of Health   Financial  Resource Strain:   . Difficulty of Paying Living Expenses: Not on file  Food Insecurity:   . Worried About Archivist in the Last Year: Not on file  . Ran Out of Food in the Last Year: Not on file  Transportation Needs:   . Lack of Transportation (Medical): Not on file  . Lack of Transportation (Non-Medical): Not on file  Physical Activity:   . Days of Exercise per Week: Not on file  . Minutes of Exercise per Session: Not on file  Stress:   . Feeling of Stress : Not on file  Social Connections:   . Frequency of Communication with Friends and Family: Not on file  . Frequency of Social Gatherings with Friends and Family: Not on file  . Attends Religious Services: Not on file  . Active Member of Clubs or Organizations: Not on file  . Attends Programme researcher, broadcasting/film/video Meetings: Not on file  . Marital Status: Not on file  Intimate Partner Violence:   . Fear of Current or Ex-Partner: Not on file  . Emotionally Abused: Not on file  . Physically Abused: Not on file  . Sexually Abused: Not on file    Family History  Problem Relation Age of Onset  . Gestational diabetes Mother   .  Migraines Mother   . High Cholesterol Mother   . High Cholesterol Father   . Diabetes Maternal Grandmother   . Diabetes Maternal Grandfather   . Breast cancer Neg Hx   . Ovarian cancer Neg Hx   . Colon cancer Neg Hx     The following portions of the patient's history were reviewed and updated as appropriate: allergies, current medications, past family history, past medical history, past social history, past surgical history and problem list.  Review of Systems Review of Systems - Negative except as mention in HPI Review of Systems - General ROS: negative for - chills, fatigue, fever, hot flashes, malaise or night sweats Hematological and Lymphatic ROS: negative for - bleeding problems or swollen lymph nodes Gastrointestinal ROS: negative for - abdominal pain, blood in stools, change in bowel habits  and nausea/vomiting Musculoskeletal ROS: negative for - joint pain, muscle pain or muscular weakness Genito-Urinary ROS: negative for - change in menstrual cycle, dysmenorrhea, dyspareunia, dysuria, genital discharge, genital ulcers, hematuria, incontinence, irregular/heavy menses, nocturia or pelvic pain  Objective:   BP 100/60   Pulse 64   Ht 5\' 2"  (1.575 m)   Wt 176 lb 3 oz (79.9 kg)   BMI 32.23 kg/m  CONSTITUTIONAL: Well-developed, well-nourished female in no acute distress.  HENT:  Normocephalic, atraumatic.  NECK: Normal range of motion, supple, no masses.  Normal thyroid.  SKIN: Skin is warm and dry. No rash noted. Not diaphoretic. No erythema. No pallor. [place]: Alert and oriented to person, place, and time. PSYCHIATRIC: Normal mood and affect. Normal behavior. Normal judgment and thought content. CARDIOVASCULAR:Not Examined RESPIRATORY: Not Examined BREASTS: Breasts: breasts appear normal, no suspicious masses, no skin or nipple changes or axillary nodes.Left breast has 1 cm healing sore @ 5 oclock, non rasised no redness, scab over, no puss or signs of infection noted. Looks like brusing was present . Possible trama from pinching of bra.  ABDOMEN: Soft, non distended; Non tender.  No Organomegaly. PELVIC:not indicated MUSCULOSKELETAL: Normal range of motion. No tenderness.  No cyanosis, clubbing, or edema.     Assessment:  Hair loss Left breast abrasion .   Plan:   Discussued that the area on breast is healing, no discharge to culture. Encouraged warm compress and tylenol as needed for pain. Signs and symptoms of infection reviewed. Lab work ordered for hair loss. Will follow up with results. Return PRN.  Philip Aspen, CNM

## 2019-04-20 LAB — THYROID PANEL WITH TSH
Free Thyroxine Index: 2 (ref 1.2–4.9)
T3 Uptake Ratio: 19 % — ABNORMAL LOW (ref 24–39)
T4, Total: 10.3 ug/dL (ref 4.5–12.0)
TSH: 1.25 u[IU]/mL (ref 0.450–4.500)

## 2019-04-25 ENCOUNTER — Telehealth: Payer: Self-pay

## 2019-04-25 ENCOUNTER — Telehealth: Payer: Self-pay | Admitting: Certified Nurse Midwife

## 2019-04-25 NOTE — Telephone Encounter (Signed)
On 04/20/19 Doreene Burke CNM sent the patient a mychart message informing her of normal lab results. She also asked if the patient would like a referral to a dermatologist for hair loss.This patient called the office today inquiring about lab results. She was verbally informed lab results were normal. She answered yes when asked if she would like a referral to a dermatologist. Patient was instructed on the procedure for referrals- Doreene Burke CNM will do a referral request and send the request to the dermatologist. The dermatologists office will contact the patient with the date and time of the appointment. Patient expressed understanding of this procedure.

## 2019-04-25 NOTE — Telephone Encounter (Signed)
Patient called to inquire about thyroid lab results. She stated she would be at work at 4:30 pm and would like to be called soon. Advised patient she could review results on MyChart and that a nurse would be giving her a call soon . Patient agreed.

## 2019-04-26 ENCOUNTER — Other Ambulatory Visit: Payer: Self-pay | Admitting: Certified Nurse Midwife

## 2019-04-26 ENCOUNTER — Ambulatory Visit: Payer: Managed Care, Other (non HMO) | Admitting: Gastroenterology

## 2019-04-26 ENCOUNTER — Telehealth: Payer: Self-pay

## 2019-04-26 DIAGNOSIS — L659 Nonscarring hair loss, unspecified: Secondary | ICD-10-CM

## 2019-04-26 NOTE — Telephone Encounter (Signed)
Mychart message sent to patient- referral has been placed to a dermatologist. Informed patient the dermatologists office will contact the patient with the date and time of an appointment.

## 2019-04-26 NOTE — Telephone Encounter (Signed)
I put in the order for the referral.   Thank you,  Pattricia Boss

## 2019-04-26 NOTE — Progress Notes (Signed)
Pt requesting referral due to hair loss. Orders placed.   Doreene Burke, CNM

## 2019-05-30 ENCOUNTER — Encounter: Payer: Self-pay | Admitting: Gastroenterology

## 2019-05-30 ENCOUNTER — Ambulatory Visit: Payer: Managed Care, Other (non HMO) | Admitting: Gastroenterology

## 2019-05-30 DIAGNOSIS — K649 Unspecified hemorrhoids: Secondary | ICD-10-CM

## 2019-10-12 ENCOUNTER — Telehealth: Payer: Self-pay

## 2019-10-12 ENCOUNTER — Telehealth: Payer: Self-pay | Admitting: Certified Nurse Midwife

## 2019-10-12 NOTE — Telephone Encounter (Signed)
mychart message sent to patient

## 2019-10-12 NOTE — Telephone Encounter (Signed)
Patient called stating she has been going back in forth with her pharmacy. They are needing prior auth for [employer] [facility] [place]. Call back number of [phone].

## 2019-10-13 ENCOUNTER — Other Ambulatory Visit: Payer: Self-pay

## 2019-10-13 MED ORDER — ETONOGESTREL-ETHINYL ESTRADIOL 0.12-0.015 MG/24HR VA RING: VAGINAL_RING | VAGINAL | 0 refills | Status: DC

## 2019-10-13 NOTE — Telephone Encounter (Signed)
Patient states that she called in regarding her birth control refill informed patient that her nurse had sent her a MyChart message as it states that It is active. Patient stated "I didn't receive anything". Informed patient that she needed to schedule an appointment for that birth control to be refilled. Scheduled patient for a medication refill appointment- requested we not schedule her for her annual just yet. Patient had some concerns regarding her birth control she states she was supposed to take it yesterday and since her appointment isn't until Monday, she is concerned that it will effect her cycle. Informed patient I would send a message to her provider and they can discuss any additional clinical questions. Patient verbalized understanding.

## 2019-10-13 NOTE — Telephone Encounter (Signed)
1 box NuvaRing sent to preferred pharmacy. Mychart message sent to patient

## 2019-10-17 ENCOUNTER — Encounter: Payer: Self-pay | Admitting: Certified Nurse Midwife

## 2019-10-17 ENCOUNTER — Telehealth: Payer: Self-pay

## 2019-10-17 ENCOUNTER — Ambulatory Visit (INDEPENDENT_AMBULATORY_CARE_PROVIDER_SITE_OTHER): Payer: Managed Care, Other (non HMO) | Admitting: Certified Nurse Midwife

## 2019-10-17 VITALS — BP 75/43 | HR 77 | Ht 62.0 in | Wt 175.6 lb

## 2019-10-17 DIAGNOSIS — Z309 Encounter for contraceptive management, unspecified: Secondary | ICD-10-CM | POA: Diagnosis not present

## 2019-10-17 MED ORDER — ETONOGESTREL-ETHINYL ESTRADIOL 0.12-0.015 MG/24HR VA RING: VAGINAL_RING | VAGINAL | 3 refills | Status: DC

## 2019-10-17 NOTE — Patient Instructions (Signed)

## 2019-10-17 NOTE — Progress Notes (Signed)
°  Medication Management Clinic Visit Note  Patient: Carly Henry MRN: [id number] Date of Birth: [birth year] PCP: Patient, No Pcp Per   Carly Henry 24 y.o. female presents for a refill on her birth control . She is not due for her annual until November. She is not sure how she got off on the [facility] count . She states one fell out so she had to replace it . She thinks maybe she might have thrown someout. She denies any issues with the nuva ring.   BP (!) 75/43    Pulse 77    Ht 5\' 2"  (1.575 m)    Wt 175 lb 9 oz (79.6 kg)    LMP 10/09/2019 (Exact Date)    BMI 32.11 kg/m   Patient Information   Past Medical History:  Diagnosis Date   Migraine       Past Surgical History:  Procedure Laterality Date   CESAREAN SECTION N/A 05/28/2017   Procedure: CESAREAN SECTION;  Surgeon: 05/30/2017, MD;  Location: [facility] ORS;  Service: Obstetrics;  Laterality: N/A;     Family History  Problem Relation Age of Onset   Gestational diabetes Mother    Migraines Mother    High Cholesterol Mother    High Cholesterol Father    Diabetes Maternal Grandmother    Diabetes Maternal Grandfather    Breast cancer Neg Hx    Ovarian cancer Neg Hx    Colon cancer Neg Hx       Social History   Substance and Sexual Activity  Alcohol Use Yes   Comment: Rare      Social History   Tobacco Use  Smoking Status Never Smoker  Smokeless Tobacco Never Used      Health Maintenance  Topic Date Due   Hepatitis C Screening  Never done   COVID-19 Vaccine (1) Never done   INFLUENZA VACCINE  10/09/2019   PAP-Cervical Cytology Screening  01/09/2022   PAP SMEAR-Modifier  01/09/2022   TETANUS/TDAP  02/21/2027   HIV Screening  Completed     Assessment and Plan: Orders placed for 4 month nuva ring. Follow up in November for annual exam.   December, CNM

## 2019-10-17 NOTE — Telephone Encounter (Signed)
Voicemail message left for patient: questions about how she is using NuvaRing- if continuous per Doreene Burke CNM she may have refills until her annual exam appointment in November.

## 2019-12-17 ENCOUNTER — Other Ambulatory Visit: Payer: Self-pay | Admitting: Certified Nurse Midwife

## 2020-01-04 ENCOUNTER — Telehealth: Payer: Self-pay

## 2020-01-04 NOTE — Telephone Encounter (Signed)
Crystal,   I am trying to have my 300 moved up to 130 because she is annual in a 15 min spot. So I do not feel like I can get her in today. IF she would have called sooner I could have done Monday or Tuesday.   Sorry,  Can she do next week?

## 2020-01-04 NOTE — Telephone Encounter (Signed)
LM informing pt. Advsied pt to call me back to let me know if she wants to be seen next week or keep AE as scheduled.

## 2020-01-04 NOTE — Telephone Encounter (Signed)
Pt needs AE this week for her job. AE is scheduled for 01/23/20.   She would like to know if you can work her in today. pls advise.   CM

## 2020-01-23 ENCOUNTER — Encounter: Payer: Managed Care, Other (non HMO) | Admitting: Certified Nurse Midwife

## 2020-03-27 ENCOUNTER — Telehealth: Payer: Self-pay

## 2020-03-27 ENCOUNTER — Other Ambulatory Visit: Payer: Self-pay | Admitting: Certified Nurse Midwife

## 2020-03-27 NOTE — Telephone Encounter (Signed)
mychart message sent to patient- needs appointment before more refills authorized.

## 2020-07-04 DIAGNOSIS — F419 Anxiety disorder, unspecified: Secondary | ICD-10-CM | POA: Insufficient documentation

## 2020-07-16 ENCOUNTER — Other Ambulatory Visit: Payer: Self-pay | Admitting: Certified Nurse Midwife

## 2021-04-27 ENCOUNTER — Other Ambulatory Visit: Payer: Self-pay | Admitting: Certified Nurse Midwife

## 2021-04-29 ENCOUNTER — Telehealth: Payer: Self-pay | Admitting: Certified Nurse Midwife

## 2021-04-29 ENCOUNTER — Other Ambulatory Visit: Payer: Self-pay

## 2021-04-29 DIAGNOSIS — Z309 Encounter for contraceptive management, unspecified: Secondary | ICD-10-CM

## 2021-04-29 MED ORDER — ETONOGESTREL-ETHINYL ESTRADIOL 0.12-0.015 MG/24HR VA RING: VAGINAL_RING | VAGINAL | 12 refills | Status: DC

## 2021-04-29 NOTE — Telephone Encounter (Signed)
Patient has not been seen since 2021. She will need to schedule an appointment to discuss birth control or a physical. Please call patient for appointment.

## 2021-04-29 NOTE — Telephone Encounter (Signed)
Pt called stating that she has been having trouble picking up her nuvaring- [facility] [place] [place]- pharmacy did not provide reason per patient. Please Advise.

## 2021-04-29 NOTE — Telephone Encounter (Signed)
Resent prescription to correct pharmacy. Patient made aware.

## 2021-05-01 ENCOUNTER — Encounter: Payer: Managed Care, Other (non HMO) | Admitting: Certified Nurse Midwife

## 2021-06-19 ENCOUNTER — Encounter: Payer: Managed Care, Other (non HMO) | Admitting: Certified Nurse Midwife

## 2021-07-02 ENCOUNTER — Encounter: Payer: Managed Care, Other (non HMO) | Admitting: Certified Nurse Midwife

## 2021-07-08 ENCOUNTER — Encounter: Payer: Managed Care, Other (non HMO) | Admitting: Certified Nurse Midwife

## 2021-11-29 ENCOUNTER — Ambulatory Visit (INDEPENDENT_AMBULATORY_CARE_PROVIDER_SITE_OTHER): Payer: Managed Care, Other (non HMO) | Admitting: Certified Nurse Midwife

## 2021-11-29 DIAGNOSIS — Z3200 Encounter for pregnancy test, result unknown: Secondary | ICD-10-CM | POA: Diagnosis not present

## 2021-11-29 LAB — POCT URINE PREGNANCY: Preg Test, Ur: POSITIVE — AB

## 2021-11-29 NOTE — Progress Notes (Unsigned)
Pt presents for pregnancy confirmation done on 11/29/21, UPT POS. G2. Pt states she is currently taking PNV. EDD 07/15/22 based on LM of 10/08/21. Pt to schedule f/u nurse visit, labs, etc. with front desk at checkout. All questions answered.

## 2021-12-02 ENCOUNTER — Ambulatory Visit: Payer: Managed Care, Other (non HMO)

## 2021-12-13 ENCOUNTER — Ambulatory Visit: Payer: Managed Care, Other (non HMO)

## 2021-12-13 ENCOUNTER — Ambulatory Visit: Payer: Self-pay

## 2021-12-13 DIAGNOSIS — Z3689 Encounter for other specified antenatal screening: Secondary | ICD-10-CM

## 2021-12-13 DIAGNOSIS — Z369 Encounter for antenatal screening, unspecified: Secondary | ICD-10-CM

## 2021-12-13 DIAGNOSIS — O99211 Obesity complicating pregnancy, first trimester: Secondary | ICD-10-CM

## 2021-12-13 DIAGNOSIS — Z3480 Encounter for supervision of other normal pregnancy, unspecified trimester: Secondary | ICD-10-CM | POA: Insufficient documentation

## 2021-12-13 NOTE — Progress Notes (Signed)
New OB Intake  I connected with  Carly Henry on 12/13/21 at  9:15 AM EDT by telephone and verified that I am speaking with the correct person using two identifiers. Nurse is located at [employer] and pt is located at home.  I explained I am completing New OB Intake today. We discussed her EDD of 07/15/2022 that is based on LMP of 10/08/2021. Pt is G2/P1001. I reviewed her allergies, medications, Medical/Surgical/OB history, and appropriate screenings. Based on history, this is a/an pregnancy uncomplicated .   Patient Active Problem List   Diagnosis Date Noted   Incisional pain 01/10/2019   Bleeding hemorrhoid 01/10/2019   Pelvic pain 01/10/2019   History of C-section 06/04/2017   Obesity (BMI 30-39.9) 12/26/2016   Chronic migraine w/o aura w/o status migrainosus, not intractable 01/26/2016   Generalized anxiety disorder 01/26/2016    Concerns addressed today Has a vaginal odor; adv to be seen for pelvic exam only - will send msg to schedulers.  Delivery Plans:  Plans to deliver at [facility].  Anatomy US Explained first scheduled [place] will be around 20 weeks. Pt requests u/s for dating/viability to be sure everything is okay.  Labs Discussed genetic screening with patient. Patient desires genetic testing to be drawn with new OB labs. Discussed possible labs to be drawn at new OB appointment.  COVID Vaccine Patient has not had COVID vaccine.   Social Determinants of Health Food Insecurity: expresses food insecurity. Information given on local food banks. Transportation: Patient denies transportation needs. Childcare: Discussed no children allowed at ultrasound appointments.   First visit review I reviewed new OB appt with pt. I explained she will have ob bloodwork and pap smear/pelvic exam if indicated. Explained pt will be seen by Lloyd Huger, CNM at first visit; encounter routed to appropriate provider.   Cleophas Dunker, [facility] 12/13/2021  9:36  AM

## 2021-12-18 ENCOUNTER — Ambulatory Visit: Payer: Managed Care, Other (non HMO)

## 2021-12-18 ENCOUNTER — Other Ambulatory Visit: Payer: Self-pay

## 2021-12-18 ENCOUNTER — Other Ambulatory Visit: Payer: Managed Care, Other (non HMO)

## 2021-12-18 ENCOUNTER — Other Ambulatory Visit (HOSPITAL_COMMUNITY)
Admission: RE | Admit: 2021-12-18 | Discharge: 2021-12-18 | Disposition: A | Discharge status: Home / Self Care | Payer: Managed Care, Other (non HMO) | Source: Ambulatory Visit | Attending: Obstetrics | Admitting: Obstetrics

## 2021-12-18 VITALS — BP 108/79 | HR 160 | Ht 62.0 in | Wt 201.0 lb

## 2021-12-18 DIAGNOSIS — N898 Other specified noninflammatory disorders of vagina: Secondary | ICD-10-CM | POA: Diagnosis present

## 2021-12-18 DIAGNOSIS — O99211 Obesity complicating pregnancy, first trimester: Secondary | ICD-10-CM

## 2021-12-18 DIAGNOSIS — Z3480 Encounter for supervision of other normal pregnancy, unspecified trimester: Secondary | ICD-10-CM

## 2021-12-18 DIAGNOSIS — Z3A10 10 weeks gestation of pregnancy: Secondary | ICD-10-CM

## 2021-12-18 DIAGNOSIS — Z369 Encounter for antenatal screening, unspecified: Secondary | ICD-10-CM

## 2021-12-18 MED ORDER — METRONIDAZOLE 500 MG PO TABS: 500.0000 mg | ORAL_TABLET | Freq: Two times a day (BID) | ORAL | 0 refills | Status: DC

## 2021-12-18 NOTE — Progress Notes (Signed)
Pt reports vaginal odor for 2 weeks, swab sent for BV. She reports no discharge or burning.

## 2021-12-19 ENCOUNTER — Encounter: Payer: Self-pay | Admitting: Certified Nurse Midwife

## 2021-12-19 ENCOUNTER — Other Ambulatory Visit: Payer: Self-pay | Admitting: Certified Nurse Midwife

## 2021-12-19 LAB — CBC/D/PLT+RPR+RH+ABO+RUBIGG...
Antibody Screen: NEGATIVE
Basophils Absolute: 0.1 10*3/uL (ref 0.0–0.2)
Basos: 1 %
EOS (ABSOLUTE): 0.2 10*3/uL (ref 0.0–0.4)
Eos: 2 %
HCV Ab: NONREACTIVE
HIV Screen 4th Generation wRfx: NONREACTIVE
Hematocrit: 40.2 % (ref 34.0–46.6)
Hemoglobin: 13.3 g/dL (ref 11.1–15.9)
Hepatitis B Surface Ag: NEGATIVE
Immature Grans (Abs): 0 10*3/uL (ref 0.0–0.1)
Immature Granulocytes: 0 %
Lymphocytes Absolute: 1.9 10*3/uL (ref 0.7–3.1)
Lymphs: 19 %
MCH: 28.2 pg (ref 26.6–33.0)
MCHC: 33.1 g/dL (ref 31.5–35.7)
MCV: 85 fL (ref 79–97)
Monocytes Absolute: 0.5 10*3/uL (ref 0.1–0.9)
Monocytes: 5 %
Neutrophils Absolute: 7.7 10*3/uL — ABNORMAL HIGH (ref 1.4–7.0)
Neutrophils: 73 %
Platelets: 313 10*3/uL (ref 150–450)
RBC: 4.72 x10E6/uL (ref 3.77–5.28)
RDW: 13.9 % (ref 11.7–15.4)
RPR Ser Ql: NONREACTIVE
Rh Factor: POSITIVE
Rubella Antibodies, IGG: 2.72 index (ref >0.99)
Varicella zoster IgG: 430 index (ref >165)
WBC: 10.5 10*3/uL (ref 3.4–10.8)

## 2021-12-19 LAB — HCV INTERPRETATION

## 2021-12-19 LAB — URINALYSIS, ROUTINE W REFLEX MICROSCOPIC
Bilirubin, UA: NEGATIVE
Glucose, UA: NEGATIVE
Ketones, UA: NEGATIVE
Leukocytes,UA: NEGATIVE
Nitrite, UA: NEGATIVE
RBC, UA: NEGATIVE
Specific Gravity, UA: >=1.03 — AB (ref 1.005–1.030)
Urobilinogen, Ur: 0.2 mg/dL (ref 0.2–1.0)
pH, UA: 6.5 (ref 5.0–7.5)

## 2021-12-19 LAB — CERVICOVAGINAL ANCILLARY ONLY
Bacterial Vaginitis (gardnerella): POSITIVE — AB
Candida Glabrata: NEGATIVE
Candida Vaginitis: NEGATIVE
Chlamydia: NEGATIVE
Comment: NEGATIVE
Comment: NEGATIVE
Comment: NEGATIVE
Comment: NEGATIVE
Comment: NEGATIVE
Comment: NORMAL
Neisseria Gonorrhea: NEGATIVE
Trichomonas: NEGATIVE

## 2021-12-19 LAB — HEMOGLOBIN A1C
Est. average glucose Bld gHb Est-mCnc: 103 mg/dL
Hgb A1c MFr Bld: 5.2 % (ref 4.8–5.6)

## 2021-12-19 LAB — NICOTINE SCREEN, URINE: Cotinine Ql Scrn, Ur: NEGATIVE ng/mL

## 2021-12-19 MED ORDER — METRONIDAZOLE 0.75 % EX GEL: 1.0000 | Freq: Every day | CUTANEOUS | 0 refills | Status: AC

## 2021-12-20 LAB — GC/CHLAMYDIA PROBE AMP
Chlamydia trachomatis, NAA: NEGATIVE
Neisseria Gonorrhoeae by PCR: NEGATIVE

## 2021-12-23 ENCOUNTER — Other Ambulatory Visit: Payer: Self-pay | Admitting: Obstetrics

## 2021-12-23 ENCOUNTER — Ambulatory Visit
Admission: RE | Admit: 2021-12-23 | Discharge: 2021-12-23 | Disposition: A | Discharge status: Home / Self Care | Payer: Managed Care, Other (non HMO) | Source: Ambulatory Visit | Attending: Obstetrics | Admitting: Obstetrics

## 2021-12-23 ENCOUNTER — Other Ambulatory Visit: Payer: Self-pay | Admitting: Certified Nurse Midwife

## 2021-12-23 DIAGNOSIS — Z3A08 8 weeks gestation of pregnancy: Secondary | ICD-10-CM | POA: Insufficient documentation

## 2021-12-23 DIAGNOSIS — Z3480 Encounter for supervision of other normal pregnancy, unspecified trimester: Secondary | ICD-10-CM | POA: Insufficient documentation

## 2021-12-23 DIAGNOSIS — Z3481 Encounter for supervision of other normal pregnancy, first trimester: Secondary | ICD-10-CM | POA: Insufficient documentation

## 2021-12-23 DIAGNOSIS — Z369 Encounter for antenatal screening, unspecified: Secondary | ICD-10-CM

## 2021-12-23 LAB — MATERNIT 21 PLUS CORE, BLOOD
Fetal Fraction: 4
Result (T21): NEGATIVE
Trisomy 13 (Patau syndrome): NEGATIVE
Trisomy 18 (Edwards syndrome): NEGATIVE
Trisomy 21 (Down syndrome): NEGATIVE

## 2021-12-23 LAB — CULTURE, OB URINE

## 2021-12-23 LAB — URINE CULTURE, OB REFLEX

## 2021-12-24 ENCOUNTER — Encounter: Payer: Self-pay | Admitting: Obstetrics

## 2021-12-24 ENCOUNTER — Telehealth: Payer: Self-pay | Admitting: Obstetrics

## 2021-12-24 NOTE — Telephone Encounter (Signed)
EDD changed to 07/30/22 based on [place]

## 2021-12-26 ENCOUNTER — Encounter: Payer: Self-pay | Admitting: Certified Nurse Midwife

## 2021-12-26 ENCOUNTER — Telehealth: Payer: Self-pay

## 2021-12-26 NOTE — Telephone Encounter (Signed)
Pt calling triage and needs an appt sooner than 10/25 d/t work purposes. Can we try to get her in sooner?

## 2021-12-31 ENCOUNTER — Other Ambulatory Visit: Payer: Self-pay

## 2021-12-31 ENCOUNTER — Ambulatory Visit: Payer: Managed Care, Other (non HMO)

## 2021-12-31 VITALS — BP 121/73 | HR 86 | Ht 62.0 in | Wt 201.0 lb

## 2021-12-31 DIAGNOSIS — Z0130 Encounter for examination of blood pressure without abnormal findings: Secondary | ICD-10-CM

## 2021-12-31 DIAGNOSIS — Z3A09 9 weeks gestation of pregnancy: Secondary | ICD-10-CM

## 2021-12-31 NOTE — Progress Notes (Signed)
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 Urine's that were due were collected at this time as well from NOB intake

## 2022-01-01 ENCOUNTER — Ambulatory Visit: Payer: Managed Care, Other (non HMO) | Admitting: Obstetrics

## 2022-01-01 ENCOUNTER — Encounter: Payer: Self-pay | Admitting: Obstetrics

## 2022-01-01 VITALS — BP 113/70 | HR 82 | Wt 202.0 lb

## 2022-01-01 DIAGNOSIS — Z3481 Encounter for supervision of other normal pregnancy, first trimester: Secondary | ICD-10-CM

## 2022-01-01 DIAGNOSIS — R030 Elevated blood-pressure reading, without diagnosis of hypertension: Secondary | ICD-10-CM

## 2022-01-01 DIAGNOSIS — Z3A10 10 weeks gestation of pregnancy: Secondary | ICD-10-CM

## 2022-01-01 LAB — POCT URINALYSIS DIPSTICK OB
Bilirubin, UA: NEGATIVE
Glucose, UA: NEGATIVE
Ketones, UA: NEGATIVE
Leukocytes, UA: NEGATIVE
Nitrite, UA: NEGATIVE
POC,PROTEIN,UA: NEGATIVE
Spec Grav, UA: 1.025 (ref 1.010–1.025)
Urobilinogen, UA: 0.2 E.U./dL
pH, UA: 7.5 (ref 5.0–8.0)

## 2022-01-01 MED ORDER — ONDANSETRON HCL 4 MG PO TABS: 4.0000 mg | ORAL_TABLET | Freq: Three times a day (TID) | ORAL | 0 refills | Status: DC | PRN

## 2022-01-01 NOTE — Progress Notes (Addendum)
NEW OB HISTORY AND PHYSICAL  SUBJECTIVE:       Carly Henry is a 26 y.o. G87P1001 female, Patient's last menstrual period was 10/08/2021 (exact date)., Estimated Date of Delivery: 07/30/22, [redacted]w[redacted]d, presents today for establishment of Prenatal Care. She reports fatigue and nausea without vomiting. She is currently being treated for BV. She had an episode recently when she was in her patrol car and she felt lightheaded and ill.; she checked her BP and it was 150/90. She came in to the office for evaluation and BP had normalized by that time. BP today is 113/70. She has a h/o prior cesarean for fetal intolerance of labor and desires TOLAC this time.  Social history Partner/Relationship: FOB involved Living situation: lives with husband and daughter. Feels safe. Work: Hydrographic surveyor Exercise: none right now Substance use: EtOH before she knew she was pregnant. Denies tobacco, vape, and recreational drugs   Gynecologic History Patient's last menstrual period was 10/08/2021 (exact date). Normal. Had negative pregnancy tests for 2 weeks after missed period. Contraception: NuvaRing vaginal inserts Last Pap: 01/10/2019. Results were: normal  Obstetric History OB History  Gravida Para Term Preterm AB Living  2 1 1     1   SAB IAB Ectopic Multiple Live Births        0 1    # Outcome Date GA Lbr Len/2nd Weight Sex Delivery Anes PTL Lv  2 Current           1 Term 05/28/17 [redacted]w[redacted]d  7 lb 0.9 oz (3.2 kg) F CS-LTranv Spinal  LIV     Complications: Failure to Progress in Second Stage    Past Medical History:  Diagnosis Date   Migraine     Past Surgical History:  Procedure Laterality Date   CESAREAN SECTION N/A 05/28/2017   Procedure: CESAREAN SECTION;  Surgeon: 05/30/2017, MD;  Location: [facility] ORS;  Service: Obstetrics;  Laterality: N/A;    No current outpatient medications on file prior to visit.   No current facility-administered medications on file prior to visit.    No  Known Allergies  Social History   Socioeconomic History   Marital status: Married    Spouse name: Hildred Laser   Number of children: 1   Years of education: 16   Highest education level: Not on file  Occupational History   Occupation: [employer]  Tobacco Use   Smoking status: Never   Smokeless tobacco: Never  Vaping Use   Vaping Use: Never used  Substance and Sexual Activity   Alcohol use: Not Currently    Comment: Rare   Drug use: No   Sexual activity: Yes    Partners: Male    Birth control/protection: Inserts, None    Comment: Nuvaring  Other Topics Concern   Not on file  Social History Narrative   Not on file   Social Determinants of Health   Financial Resource Strain: Low Risk  (12/13/2021)   Overall Financial Resource Strain (CARDIA)    Difficulty of Paying Living Expenses: Not very hard  Food Insecurity: Food Insecurity Present (12/13/2021)   Hunger Vital Sign    Worried About Running Out of Food in the Last Year: Sometimes true    Ran Out of Food in the Last Year: Never true  Transportation Needs: No Transportation Needs (12/13/2021)   PRAPARE - 02/12/2022 (Medical): No    Lack of Transportation (Non-Medical): No  Physical Activity: Inactive (12/13/2021)   Exercise Vital  Sign    Days of Exercise per Week: 0 days    Minutes of Exercise per Session: 0 min  Stress: No Stress Concern Present (12/13/2021)   [place]    Feeling of Stress : Not at all  Social Connections: Moderately Integrated (12/13/2021)   Social Connection and Isolation Panel [NHANES]    Frequency of Communication with Friends and Family: More than three times a week    Frequency of Social Gatherings with Friends and Family: Twice a week    Attends Religious Services: 1 to 4 times per year    Active Member of [employer] or Organizations: No    Attends Archivist Meetings: Never     Marital Status: Married  Human resources officer Violence: Not At Risk (12/13/2021)   Humiliation, Afraid, Rape, and Kick questionnaire    Fear of Current or Ex-Partner: No    Emotionally Abused: No    Physically Abused: No    Sexually Abused: No    Family History  Problem Relation Age of Onset   Gestational diabetes Mother    Migraines Mother    High Cholesterol Mother    High Cholesterol Father    Asthma Brother    Diabetes Maternal Grandmother    Diabetes Maternal Grandfather    Other Maternal Grandfather        kdiney problem - on dialysis   Hypertension Paternal Grandmother    Hypertension Paternal Grandfather    Breast cancer Neg Hx    Ovarian cancer Neg Hx    Colon cancer Neg Hx     The following portions of the patient's history were reviewed and updated as appropriate: allergies, current medications, past OB history, past medical history, past surgical history, past family history, past social history, and problem list.  History obtained from the patient General ROS: negative for - chills, fatigue, fever, or night sweats Psychological ROS: negative Ophthalmic ROS: negative for - blurry vision or decreased vision ENT ROS: positive for - headaches negative for - sore throat or visual changes Hematological and Lymphatic ROS: negative for - bleeding problems, bruising, or swollen lymph nodes Endocrine ROS: negative for - breast changes or palpitations Breast ROS: negative for breast lumps Respiratory ROS: no cough, shortness of breath, or wheezing Cardiovascular ROS: no chest pain or dyspnea on exertion Gastrointestinal ROS: no abdominal pain, change in bowel habits, or black or bloody stools Genito-Urinary ROS: no dysuria, trouble voiding, or hematuria Musculoskeletal ROS: positive for - chronic low back pain Dermatological ROS: negative   OBJECTIVE: Initial Physical Exam (New OB)  GENERAL APPEARANCE: alert, well appearing, in no apparent distress HEAD: normocephalic,  atraumatic MOUTH: mucous membranes moist, pharynx normal without lesions THYROID: no thyromegaly or masses present BREASTS: no masses noted, no significant tenderness, no palpable axillary nodes, no skin changes LUNGS: clear to auscultation, no wheezes, rales or rhonchi, symmetric air entry HEART: regular rate and rhythm, murmur present ABDOMEN: soft, nontender, nondistended, no abnormal masses, no epigastric pain, fundus not palpable, and FHT present (160 bpm) EXTREMITIES: no redness or tenderness in the calves or thighs SKIN: normal coloration and turgor, no rashes LYMPH NODES: no adenopathy palpable NEUROLOGIC: alert, oriented, normal speech, no focal findings or movement disorder noted  PELVIC EXAM declined  ASSESSMENT: Normal pregnancy [redacted]w[redacted]d   PLAN: Routine prenatal care. We discussed an overview of prenatal care and when to call. Reviewed diet, exercise, and weight gain recommendations in pregnancy. Zofran prescribed. Discussed benefits of breastfeeding and  lactation resources at [facility]. UPC ordered. Discussed GBS status and treatment in labor. Desires f/u scan. RTC in 4 weeks. I reviewed labs and answered all questions.  See orders  Guadlupe Spanish, CNM

## 2022-01-02 ENCOUNTER — Telehealth: Payer: Self-pay

## 2022-01-02 LAB — MONITOR DRUG PROFILE 14(MW)
Amphetamine Scrn, Ur: NEGATIVE ng/mL
BARBITURATE SCREEN URINE: NEGATIVE ng/mL
BENZODIAZEPINE SCREEN, URINE: NEGATIVE ng/mL
Buprenorphine, Urine: NEGATIVE ng/mL
CANNABINOIDS UR QL SCN: NEGATIVE ng/mL
Cocaine (Metab) Scrn, Ur: NEGATIVE ng/mL
Creatinine(Crt), U: 180.8 mg/dL (ref 20.0–300.0)
Fentanyl, Urine: NEGATIVE pg/mL
Meperidine Screen, Urine: NEGATIVE ng/mL
Methadone Screen, Urine: NEGATIVE ng/mL
OXYCODONE+OXYMORPHONE UR QL SCN: NEGATIVE ng/mL
Opiate Scrn, Ur: NEGATIVE ng/mL
Ph of Urine: 6.8 (ref 4.5–8.9)
Phencyclidine Qn, Ur: NEGATIVE ng/mL
Propoxyphene Scrn, Ur: NEGATIVE ng/mL
SPECIFIC GRAVITY: 1.023
Tramadol Screen, Urine: NEGATIVE ng/mL

## 2022-01-02 NOTE — Telephone Encounter (Signed)
Pt called today and is wanting/needing a letter for work to put her on "light duty". She works at [employer]. I told her I would have to send to provider to get this specific letter written. Please advise. You saw pt for NOB yesterday.

## 2022-01-02 NOTE — Telephone Encounter (Signed)
Yes ma'am , I will get it written and sent to patient

## 2022-01-03 LAB — PROTEIN / CREATININE RATIO, URINE
Creatinine, Urine: 147.9 mg/dL
Protein, Ur: 19.5 mg/dL
Protein/Creat Ratio: 132 mg/g creat (ref 0–200)

## 2022-01-06 NOTE — Telephone Encounter (Signed)
Letter has been sent

## 2022-01-13 ENCOUNTER — Telehealth: Payer: Self-pay

## 2022-01-13 NOTE — Telephone Encounter (Signed)
Patient calling in to check the status of her FMLA. She states that her employer is asking for a letter stating she needs to be on light duty. Please advise patient.

## 2022-01-15 ENCOUNTER — Ambulatory Visit: Payer: Managed Care, Other (non HMO)

## 2022-01-15 DIAGNOSIS — Z3A10 10 weeks gestation of pregnancy: Secondary | ICD-10-CM

## 2022-01-15 DIAGNOSIS — Z3A12 12 weeks gestation of pregnancy: Secondary | ICD-10-CM

## 2022-01-15 DIAGNOSIS — Z362 Encounter for other antenatal screening follow-up: Secondary | ICD-10-CM

## 2022-01-15 NOTE — Telephone Encounter (Signed)
Pt aware via msg that I was completing today

## 2022-01-16 NOTE — Telephone Encounter (Signed)
Pt was seen on 01/01/22 with Carly Henry.

## 2022-01-19 ENCOUNTER — Encounter: Payer: Self-pay | Admitting: Obstetrics

## 2022-01-21 ENCOUNTER — Ambulatory Visit (INDEPENDENT_AMBULATORY_CARE_PROVIDER_SITE_OTHER): Payer: Managed Care, Other (non HMO)

## 2022-01-21 ENCOUNTER — Other Ambulatory Visit (HOSPITAL_COMMUNITY)
Admission: RE | Admit: 2022-01-21 | Discharge: 2022-01-21 | Disposition: A | Discharge status: Home / Self Care | Payer: Managed Care, Other (non HMO) | Source: Ambulatory Visit | Attending: Licensed Practical Nurse | Admitting: Licensed Practical Nurse

## 2022-01-21 VITALS — BP 121/69 | HR 76 | Ht 62.0 in | Wt 200.0 lb

## 2022-01-21 DIAGNOSIS — N898 Other specified noninflammatory disorders of vagina: Secondary | ICD-10-CM | POA: Diagnosis present

## 2022-01-21 LAB — POCT URINALYSIS DIPSTICK OB
Bilirubin, UA: NEGATIVE
Glucose, UA: NEGATIVE
Ketones, UA: NEGATIVE
Leukocytes, UA: NEGATIVE
Nitrite, UA: NEGATIVE
POC,PROTEIN,UA: NEGATIVE
Spec Grav, UA: 1.01 (ref 1.010–1.025)
Urobilinogen, UA: 0.2 E.U./dL
pH, UA: 7 (ref 5.0–8.0)

## 2022-01-21 NOTE — Progress Notes (Signed)
Patient presents today due to UTI symptoms. 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 Urine dipstick preformed, showing all negative only a trace of blood  . Per office protocol sent urine for culture and swab to lab  Patient states no other concerns at this time. All questions answered.

## 2022-01-23 LAB — URINE CULTURE

## 2022-01-23 LAB — CERVICOVAGINAL ANCILLARY ONLY
Bacterial Vaginitis (gardnerella): POSITIVE — AB
Candida Glabrata: NEGATIVE
Candida Vaginitis: POSITIVE — AB
Chlamydia: NEGATIVE
Comment: NEGATIVE
Comment: NEGATIVE
Comment: NEGATIVE
Comment: NEGATIVE
Comment: NEGATIVE
Comment: NORMAL
Neisseria Gonorrhea: NEGATIVE
Trichomonas: NEGATIVE

## 2022-01-24 ENCOUNTER — Other Ambulatory Visit: Payer: Self-pay | Admitting: Licensed Practical Nurse

## 2022-01-24 DIAGNOSIS — B9689 Other specified bacterial agents as the cause of diseases classified elsewhere: Secondary | ICD-10-CM

## 2022-01-24 MED ORDER — METRONIDAZOLE 500 MG PO TABS: 500.0000 mg | ORAL_TABLET | Freq: Two times a day (BID) | ORAL | 0 refills | Status: DC

## 2022-01-24 NOTE — Progress Notes (Signed)
TC: reviewed recent labs, you have BV and Yeast Pt was treated for BV earlier this month. Will repeat round of Flagyl, if symptoms persist may need additional testing. Script sent to pharmacy on file.  Reviewed treatment for yeast, will get OTC Monistat 7 Reviewed urine culture, you were treated for GBS 2 weeks ago, it is to soon to now if it cleared, you will need to repeat urine culture at next ROB. Aware she will need antibiotics in labor. Carie Caddy, CNM   [facility]  01/24/22  12:24 PM

## 2022-02-03 ENCOUNTER — Ambulatory Visit: Payer: Managed Care, Other (non HMO) | Admitting: Licensed Practical Nurse

## 2022-02-03 VITALS — BP 114/70 | HR 77 | Wt 200.9 lb

## 2022-02-03 DIAGNOSIS — Z3482 Encounter for supervision of other normal pregnancy, second trimester: Secondary | ICD-10-CM

## 2022-02-03 DIAGNOSIS — Z3480 Encounter for supervision of other normal pregnancy, unspecified trimester: Secondary | ICD-10-CM

## 2022-02-03 DIAGNOSIS — Z3A14 14 weeks gestation of pregnancy: Secondary | ICD-10-CM

## 2022-02-03 LAB — POCT URINALYSIS DIPSTICK
Bilirubin, UA: NEGATIVE
Blood, UA: NEGATIVE
Glucose, UA: NEGATIVE
Ketones, UA: NEGATIVE
Leukocytes, UA: NEGATIVE
Nitrite, UA: NEGATIVE
Protein, UA: NEGATIVE
Spec Grav, UA: >=1.03 — AB (ref 1.010–1.025)
Urobilinogen, UA: 0.2 E.U./dL
pH, UA: 5 (ref 5.0–8.0)

## 2022-02-03 NOTE — Progress Notes (Signed)
Routine Prenatal Care Visit  Subjective  Carly Henry is a 26 y.o. G2P1001 at 106w5d being seen today for ongoing prenatal care.  She is currently monitored for the following issues for this high-risk pregnancy and has Chronic migraine w/o aura w/o status migrainosus, not intractable; Generalized anxiety disorder; Obesity (BMI 30-39.9); History of C-section; Incisional pain; Bleeding hemorrhoid; Pelvic pain; and Supervision of other normal pregnancy, antepartum on their problem list.  ----------------------------------------------------------------------------------- Patient reports  Has labial swelling after IC, often has discomfort with IC-even prior to pregnancy- Has concerns because she had a UTI, BV and then a yeast infection and with above complaints wonders if something is "going on down there". Admits she has always been "sensitive" in the pelvic region. She notices irritation when her husband ejects inside of her.  Reviewed having partner shower prior to IC-he does. Try condoms and lubricants. Labial swelling with IC can be normal in pregnancy.  -having worsening sciatic pain, encourage pt to see her Chiropractor  Contractions: Not present. Vag. Bleeding: None.  Movement: Present. Leaking Fluid denies.  ----------------------------------------------------------------------------------- The following portions of the patient's history were reviewed and updated as appropriate: allergies, current medications, past family history, past medical history, past social history, past surgical history and problem list. Problem list updated.  Objective  Blood pressure 114/70, pulse 77, weight 200 lb 14.4 oz (91.1 kg), last menstrual period 10/08/2021. Pregravid weight 188 lb (85.3 kg) Total Weight Gain 12 lb 14.4 oz (5.851 kg) Urinalysis: Urine Protein    Urine Glucose    Fetal Status:   Fundal Height: 140 cm Movement: Present     General:  Alert, oriented and cooperative. Patient is in no  acute distress.  Skin: Skin is warm and dry. No rash noted.   Cardiovascular: Normal heart rate noted  Respiratory: Normal respiratory effort, no problems with respiration noted  Abdomen: Soft, gravid, appropriate for gestational age. Pain/Pressure: Absent     Pelvic:  Cervical exam deferred        Extremities: Normal range of motion.  Edema: None  Mental Status: Normal mood and affect. Normal behavior. Normal judgment and thought content.   Assessment   26 y.o. G2P1001 at [redacted]w[redacted]d by  07/30/2022, Alternate EDD Entry presenting for routine prenatal visit  Plan   g2 Problems (from 11/29/21 to present)     Problem Noted Resolved   Supervision of other normal pregnancy, antepartum 12/13/2021 by Loran Senters, CMA No   Overview Signed 12/13/2021  9:39 AM by Loran Senters, CMA     Clinical Staff Provider  Office Location  [facility] Dating  07/15/2022, by Last Menstrual Period  Language  English Anatomy US    Flu Vaccine  offer Genetic Screen  NIPS:   TDaP vaccine   offer Hgb A1C or  GTT Early : Third trimester :   Covid declined   LAB RESULTS   Rhogam     Blood Type     Feeding Plan breast Antibody    Contraception undecided Rubella    Circumcision no RPR     Pediatrician  [facility]     Support Person [employer], Mom HIV    Prenatal Classes yes Varicella     GBS  (For PCN allergy, check sensitivities)   BTL Consent  Hep C     VBAC Consent  Pap Diagnosis  Date Value Ref Range Status  01/10/2019      - Negative for intraepithelial lesion or malignancy (NILM)      Hgb  Electro      CF      SMA                    general obstetric precautions including but not limited to vaginal bleeding, contractions, leaking of fluid and fetal movement were reviewed in detail with the patient. Please refer to After Visit Summary for other counseling recommendations.   Return in about 4 weeks (around 03/03/2022) for ROB.  Anatomy US ordered   [place]  02/03/22  1:13 PM

## 2022-02-06 ENCOUNTER — Encounter: Payer: Self-pay | Admitting: Licensed Practical Nurse

## 2022-03-04 ENCOUNTER — Ambulatory Visit: Payer: Managed Care, Other (non HMO)

## 2022-03-04 ENCOUNTER — Other Ambulatory Visit: Payer: Self-pay | Admitting: Obstetrics and Gynecology

## 2022-03-04 ENCOUNTER — Encounter: Payer: Self-pay | Admitting: Obstetrics and Gynecology

## 2022-03-04 ENCOUNTER — Ambulatory Visit (INDEPENDENT_AMBULATORY_CARE_PROVIDER_SITE_OTHER): Payer: Managed Care, Other (non HMO) | Admitting: Obstetrics and Gynecology

## 2022-03-04 VITALS — BP 101/68 | HR 85 | Wt 204.3 lb

## 2022-03-04 DIAGNOSIS — Z3689 Encounter for other specified antenatal screening: Secondary | ICD-10-CM | POA: Diagnosis not present

## 2022-03-04 DIAGNOSIS — Z1379 Encounter for other screening for genetic and chromosomal anomalies: Secondary | ICD-10-CM

## 2022-03-04 DIAGNOSIS — Z3A19 19 weeks gestation of pregnancy: Secondary | ICD-10-CM

## 2022-03-04 DIAGNOSIS — Z362 Encounter for other antenatal screening follow-up: Secondary | ICD-10-CM

## 2022-03-04 DIAGNOSIS — Z3A18 18 weeks gestation of pregnancy: Secondary | ICD-10-CM

## 2022-03-04 DIAGNOSIS — Z3480 Encounter for supervision of other normal pregnancy, unspecified trimester: Secondary | ICD-10-CM

## 2022-03-04 LAB — POCT URINALYSIS DIPSTICK OB
Bilirubin, UA: NEGATIVE
Blood, UA: NEGATIVE
Glucose, UA: NEGATIVE
Ketones, UA: NEGATIVE
Leukocytes, UA: NEGATIVE
Nitrite, UA: NEGATIVE
Spec Grav, UA: 1.01 (ref 1.010–1.025)
Urobilinogen, UA: 0.2 E.U./dL
pH, UA: 7 (ref 5.0–8.0)

## 2022-03-04 NOTE — Progress Notes (Signed)
ROB: Doing well.  Missed her ultrasound appointment this morning-will reschedule.  Starting to feel fetal movement.  Has midline pain over her cesarean delivery scar.  Occasional sciatica-discussed stretching exercises.  aFP today.

## 2022-03-06 LAB — AFP, SERUM, OPEN SPINA BIFIDA
AFP MoM: 0.82
AFP Value: 33.8 ng/mL
Gest. Age on Collection Date: 18.9 weeks
Maternal Age At EDD: 27.4 yr
OSBR Risk 1 IN: 10000
Test Results:: NEGATIVE
Weight: 204 [lb_av]

## 2022-03-10 NOTE — L&D Delivery Note (Signed)
  Vaginal Delivery Report   Patient: Carly Henry MRN: [id number] DOB: 1995/05/03  Primary OB: [facility] Gestational Age: [redacted]w[redacted]d   Antepartum complications:   Encounter for induction of labor ([facility])   Fetus with suspected cleft lip and palate, antepartum, fetus 1 ([facility]HS-HCC)   Anxiety   Migraine   GBS bacteriuria   Previous cesarean delivery affecting pregnancy, antepartum ([facility]HS-HCC)   Delivery Information: Baby Name / Sex: Baby Girl  [name] / female Date/Time of Birth: 07/25/2022 / 5:30 AM Birth Weight: No birth weight on file. Apgars: 8  9        Delivery type: VBAC, Spontaneous Lacerations:   Episiotomy: None  Perineal: 2nd - repaired Periurethral: bilateral - repaired                 Placenta: Expressed / Intact Labor Complications:  None Anesthesia: Epidural                  Delivering clinician Attending on call Resident Resident Delivery Nurse Delivery Assist Nicholes Marit Jenkins Olam, Joane Fret, MD Nicholes Marit Jenkins Shvygin, Therisa Schillings, Samantha Singletary, Katlyn     Delivery Blood Loss: 400 ml  _________________  Procedure Details:  Called to see [name] for delivery, having pushing for approximately 10 minutes and delivered a viable infant, assigned Female at birth.    Labor was induced for elective IOL. Delayed cord clamping was  performed.  The third stage of labor was actively managed with pitocin .   Placenta delivered: 07/25/2022  5:34 AM --Expressed , Appearance: Intact  with a 3 vessel cord and a central cord insertion.   Cord gases sent: No  .   The perineum was inspected, notable for a(n) 2nd degree laceration, bilateral periurethral laceration, and right sulcal laceration  Repaired in the normal fashion, achieving good hemostasis with the tissue well approximated. TXA was administered due to laceration bleeding.  Both the birthing parent and neonate were stable in  the fourth stage.   Delivery Complications: None  Isabel A. Nicholes, MD Obstetrics and Gynecology, PGY-1 [facility] Pager [phone]  ------------------------------------------------------------------------------- Attestation signed by Olam Joane Fret, MD at 07/25/2022  6:25 AM Attestation Statement:   I personally saw and evaluated the patient, and participated in the management and treatment plan as documented in the resident/fellow note.  JOANE FRET OLAM, MD  -------------------------------------------------------------------------------

## 2022-03-31 ENCOUNTER — Other Ambulatory Visit: Payer: Self-pay

## 2022-03-31 DIAGNOSIS — Z362 Encounter for other antenatal screening follow-up: Secondary | ICD-10-CM

## 2022-04-01 ENCOUNTER — Other Ambulatory Visit: Payer: Managed Care, Other (non HMO)

## 2022-04-01 ENCOUNTER — Encounter: Payer: Self-pay | Admitting: Obstetrics

## 2022-04-01 ENCOUNTER — Ambulatory Visit (INDEPENDENT_AMBULATORY_CARE_PROVIDER_SITE_OTHER): Payer: Managed Care, Other (non HMO) | Admitting: Obstetrics

## 2022-04-01 VITALS — BP 108/67 | HR 76 | Wt 205.0 lb

## 2022-04-01 DIAGNOSIS — Z3480 Encounter for supervision of other normal pregnancy, unspecified trimester: Secondary | ICD-10-CM

## 2022-04-01 DIAGNOSIS — Z3482 Encounter for supervision of other normal pregnancy, second trimester: Secondary | ICD-10-CM

## 2022-04-01 DIAGNOSIS — Z3A22 22 weeks gestation of pregnancy: Secondary | ICD-10-CM

## 2022-04-01 LAB — POCT URINALYSIS DIPSTICK OB
Bilirubin, UA: NEGATIVE
Glucose, UA: NEGATIVE
Ketones, UA: NEGATIVE
Leukocytes, UA: NEGATIVE
Nitrite, UA: NEGATIVE
Spec Grav, UA: 1.02 (ref 1.010–1.025)
Urobilinogen, UA: 0.2 E.U./dL
pH, UA: 7 (ref 5.0–8.0)

## 2022-04-01 MED ORDER — PRENATAL MULTI +DHA 27-0.8-228 MG PO CAPS: 30.0000 | ORAL_CAPSULE | ORAL | 9 refills | Status: AC

## 2022-04-01 NOTE — Progress Notes (Signed)
ROB at [redacted]w[redacted]d. Active baby. Denies ctx, LOF, and vaginal bleeding. Carly Henry reports back pain. She saw a chiropractor prior to pregnancy and is considering going back.  Having some BH ctx. Reviewed kick counts and s/s of PTL. Briefly discussed VBAC plan. Needs visit with MD at 28-32 weeks for VBAC consent. F/u anatomy scan 04/09/22. RTC in 4 weeks.

## 2022-04-01 NOTE — Addendum Note (Signed)
Addended by: Landis Gandy on: 04/01/2022 03:33 PM   Modules accepted: Orders

## 2022-04-01 NOTE — Addendum Note (Signed)
Addended by: Landis Gandy on: 04/01/2022 11:50 AM   Modules accepted: Orders

## 2022-04-05 LAB — URINE CULTURE, OB REFLEX

## 2022-04-05 LAB — CULTURE, OB URINE

## 2022-04-08 NOTE — Telephone Encounter (Signed)
Patient is scheduled for 2/1 for nurse visit

## 2022-04-08 NOTE — Telephone Encounter (Signed)
Please schdule self swab nurse apt

## 2022-04-09 ENCOUNTER — Ambulatory Visit
Admission: RE | Admit: 2022-04-09 | Discharge: 2022-04-09 | Disposition: A | Discharge status: Home / Self Care | Payer: Managed Care, Other (non HMO) | Source: Ambulatory Visit | Attending: Obstetrics and Gynecology | Admitting: Obstetrics and Gynecology

## 2022-04-09 DIAGNOSIS — Z362 Encounter for other antenatal screening follow-up: Secondary | ICD-10-CM | POA: Diagnosis not present

## 2022-04-10 ENCOUNTER — Telehealth: Payer: Self-pay

## 2022-04-10 ENCOUNTER — Ambulatory Visit (INDEPENDENT_AMBULATORY_CARE_PROVIDER_SITE_OTHER): Payer: Managed Care, Other (non HMO)

## 2022-04-10 VITALS — BP 109/54 | HR 80 | Ht 61.0 in | Wt 206.0 lb

## 2022-04-10 DIAGNOSIS — B3731 Acute candidiasis of vulva and vagina: Secondary | ICD-10-CM

## 2022-04-10 NOTE — Progress Notes (Signed)
    NURSE VISIT NOTE  Subjective:    Patient ID: Roma Kayser, female    DOB: 08-09-1995, 27 y.o.   MRN: [id number]  HPI  Patient is a 27 y.o. G65P1001 female who presents for white vaginal discharge for a couple week(s).Yeast noted in urine specimen.  Denies abnormal vaginal bleeding or significant pelvic pain or fever. denies dysuria, hematuria, urinary frequency, urinary urgency, flank pain, abdominal pain, pelvic pain, cloudy malordorous urine, genital rash, and genital irritation. Patient denies history of known exposure to STD.   Objective:    BP (!) 109/54   Pulse 80   Ht 5\' 1"  (1.549 m)   Wt 206 lb (93.4 kg)   LMP 10/08/2021 (Exact Date)   BMI 38.92 kg/m    Assessment:   1. Vaginal yeast infection      Plan:   probe sent to lab. Treatment: abstain from coitus during course of treatment ROV prn if symptoms persist or worsen.   Landis Gandy, [place]

## 2022-04-10 NOTE — Telephone Encounter (Signed)
Patient reports she had follow up anatomy scan yesterday. She has just seen report that shows: Findings consistent with a cleft lip. She would like to discuss with provider.

## 2022-04-11 ENCOUNTER — Ambulatory Visit (INDEPENDENT_AMBULATORY_CARE_PROVIDER_SITE_OTHER): Payer: Managed Care, Other (non HMO) | Admitting: Obstetrics and Gynecology

## 2022-04-11 ENCOUNTER — Encounter: Payer: Self-pay | Admitting: Obstetrics and Gynecology

## 2022-04-11 VITALS — BP 108/59 | HR 85 | Wt 206.9 lb

## 2022-04-11 DIAGNOSIS — O35AXX9 Maternal care for other (suspected) fetal abnormality and damage, fetal facial anomalies, other fetus: Secondary | ICD-10-CM | POA: Diagnosis not present

## 2022-04-11 DIAGNOSIS — Z130 Encounter for screening for diseases of the blood and blood-forming organs and certain disorders involving the immune mechanism: Secondary | ICD-10-CM

## 2022-04-11 DIAGNOSIS — Z3480 Encounter for supervision of other normal pregnancy, unspecified trimester: Secondary | ICD-10-CM

## 2022-04-11 DIAGNOSIS — Z113 Encounter for screening for infections with a predominantly sexual mode of transmission: Secondary | ICD-10-CM

## 2022-04-11 DIAGNOSIS — Z3A24 24 weeks gestation of pregnancy: Secondary | ICD-10-CM

## 2022-04-11 DIAGNOSIS — Z3482 Encounter for supervision of other normal pregnancy, second trimester: Secondary | ICD-10-CM

## 2022-04-11 DIAGNOSIS — Z131 Encounter for screening for diabetes mellitus: Secondary | ICD-10-CM

## 2022-04-11 NOTE — Telephone Encounter (Signed)
Patient coming in office to discuss concerns and next steps with Dr. Marcelline Mates

## 2022-04-11 NOTE — Progress Notes (Signed)
ROB [redacted]w[redacted]d: She is here today to discuss ultrasound results. She is doing well. She has good fetal movement. No other concerns today.

## 2022-04-11 NOTE — Telephone Encounter (Signed)
Spoke with patient. Scheduled appointment with Dr. Marcelline Mates today @10 :59.

## 2022-04-12 NOTE — Progress Notes (Signed)
ROB Consult: Patient is a 27 y.o. G2P1001 at [redacted]w[redacted]d.  She presents today for consultation and discussion of her recent ultrasound results.  Denies any OB complaints today.  Patient had anatomy scan performed on 03/04/2022, which was incomplete for patient to profile.  Patient had repeat scan on 04/09/2022, which showed concerns for cleft lip.  Patient notes that she is very concerned and has done some research on the Internet which makes her very worried about her baby.  I had an in-depth discussion with patient regarding diagnosis of cleft lip.  I discussed next steps which would be evaluation by MFM.  I also reviewed the likely course of action after delivery.  Patient wonders if she needs to transfer her care to a tertiary facility.  I discussed with patient that depending on her neck scan if cleft lift is identified then this could likely be managed at [facility].  If there was noted to be cleft lip and palate, which may present breathing and feeding issues after birth, it may be beneficial for her to deliver at a tertiary care center.  Genean also has concerns of whether or not she should still consider [place] versus having a repeat C-section.  I discussed that the method of birth was still her choice, and that this condition would not be affected by childbirth.  Patient notes understanding.  Desires to see images, showed patient ultrasound images identifying cleft lip. Referral placed to MFM for additional imaging.  Given handout on cleft lip. RTC for regular scheduled OB appointment in approximately 2 to 3 weeks. Will be due for 28 week labs then.    A total of 25 minutes were spent during this encounter, including review of previous progress notes, recent imaging and labs, face-to-face with time with patient involving counseling and coordination of care, as well as documentation for current visit.

## 2022-04-14 ENCOUNTER — Encounter: Payer: Self-pay | Admitting: Obstetrics

## 2022-04-14 ENCOUNTER — Other Ambulatory Visit: Payer: Self-pay | Admitting: Obstetrics

## 2022-04-14 ENCOUNTER — Other Ambulatory Visit: Payer: Self-pay

## 2022-04-14 DIAGNOSIS — O35AXX9 Maternal care for other (suspected) fetal abnormality and damage, fetal facial anomalies, other fetus: Secondary | ICD-10-CM

## 2022-04-14 LAB — NUSWAB VAGINITIS PLUS (VG+)
Atopobium vaginae: HIGH Score — AB
Candida albicans, NAA: POSITIVE — AB
Candida glabrata, NAA: NEGATIVE
Chlamydia trachomatis, NAA: NEGATIVE
Neisseria gonorrhoeae, NAA: NEGATIVE
Trich vag by NAA: NEGATIVE

## 2022-04-14 MED ORDER — METRONIDAZOLE 500 MG PO TABS: 500.0000 mg | ORAL_TABLET | Freq: Two times a day (BID) | ORAL | 0 refills | Status: DC

## 2022-04-14 NOTE — Progress Notes (Signed)
+  BV and yeast. Rx for metronidazole 500 mg PO BID x 7 days sent to pharmacy. Advised to use 7-day OTC Monistat. [place] notified via [place].  M. Norberto Sorenson, CNM

## 2022-04-14 NOTE — Progress Notes (Signed)
Placed MFM ultrasound order needed for MFM consult.

## 2022-04-15 ENCOUNTER — Telehealth: Payer: Self-pay

## 2022-04-15 NOTE — Telephone Encounter (Signed)
Patient calling to follow up on previous message. Advised referral was faxed yesterday for scheduling. Advised their first openeing is ~2/29. Patient prefers sooner appointment as her husband wasn't able to attend the last appointment. They are anxious to discuss. Advised will send to [facility] as they may have something a little sooner. Referral rerouted to [facility]. Patient also expressing concerns about being on so many different medicines for UTI, yeast, BV this pregnancy. Inquiring if this affected the baby. Advised that meds that were prescribed during pregnancy are safe

## 2022-04-15 NOTE — Telephone Encounter (Signed)
Pt calling about rxs sent to pharm and also wants to check on status of referral about baby's cleft lip.  [phone]

## 2022-04-16 NOTE — Telephone Encounter (Signed)
Please see separate telephone encounter for completion of this message.

## 2022-04-16 NOTE — Addendum Note (Signed)
Addended by: Meryl Dare on: 04/16/2022 09:18 AM   Modules accepted: Orders

## 2022-04-29 ENCOUNTER — Ambulatory Visit (INDEPENDENT_AMBULATORY_CARE_PROVIDER_SITE_OTHER): Payer: Managed Care, Other (non HMO) | Admitting: Advanced Practice Midwife

## 2022-04-29 ENCOUNTER — Encounter: Payer: Self-pay | Admitting: Advanced Practice Midwife

## 2022-04-29 VITALS — BP 109/69 | HR 74 | Wt 205.0 lb

## 2022-04-29 DIAGNOSIS — Z3A26 26 weeks gestation of pregnancy: Secondary | ICD-10-CM

## 2022-04-29 DIAGNOSIS — Z3482 Encounter for supervision of other normal pregnancy, second trimester: Secondary | ICD-10-CM

## 2022-04-29 LAB — POCT URINALYSIS DIPSTICK OB
Bilirubin, UA: NEGATIVE
Glucose, UA: NEGATIVE
Ketones, UA: NEGATIVE
Leukocytes, UA: NEGATIVE
Nitrite, UA: NEGATIVE
POC,PROTEIN,UA: NEGATIVE
Spec Grav, UA: 1.02 (ref 1.010–1.025)
Urobilinogen, UA: 0.2 E.U./dL
pH, UA: 7 (ref 5.0–8.0)

## 2022-04-29 NOTE — Progress Notes (Signed)
Routine Prenatal Care Visit  Subjective  Carly Henry is a 27 y.o. G2P1001 at 40w6dbeing seen today for ongoing prenatal care.  She is currently monitored for the following issues for this low-risk pregnancy and has Chronic migraine w/o aura w/o status migrainosus, not intractable; Generalized anxiety disorder; Obesity (BMI 30-39.9); History of C-section; Incisional pain; Bleeding hemorrhoid; Pelvic pain; and Supervision of other normal pregnancy, antepartum on their problem list.  ----------------------------------------------------------------------------------- Patient reports she is doing well. She has questions regarding Rc/s vs VBAC and requests information from previous delivery notes. We discussed the pre-op progress note and the op note so she can make a more informed decision. She thought she was doing her 28 week labs today, however, it was not scheduled at her last visit so she will do the labs at next visit in 2 weeks.  Contractions: Not present. Vag. Bleeding: None.  Movement: Present. Leaking Fluid denies.  ----------------------------------------------------------------------------------- The following portions of the patient's history were reviewed and updated as appropriate: allergies, current medications, past family history, past medical history, past social history, past surgical history and problem list. Problem list updated.  Objective  Blood pressure 109/69, pulse 74, weight 205 lb (93 kg), last menstrual period 10/08/2021. Pregravid weight 188 lb (85.3 kg) Total Weight Gain 17 lb (7.711 kg) Urinalysis: Urine Protein Negative  Urine Glucose Negative  Fetal Status: Fetal Heart Rate (bpm): 140 Fundal Height: 27 cm Movement: Present     General:  Alert, oriented and cooperative. Patient is in no acute distress.  Skin: Skin is warm and dry. No rash noted.   Cardiovascular: Normal heart rate noted  Respiratory: Normal respiratory effort, no problems with respiration  noted  Abdomen: Soft, gravid, appropriate for gestational age. Pain/Pressure: Absent     Pelvic:  Cervical exam deferred        Extremities: Normal range of motion.  Edema: None  Mental Status: Normal mood and affect. Normal behavior. Normal judgment and thought content.   Assessment   27y.o. G2P1001 at 253w6dy  07/30/2022, by Ultrasound presenting for routine prenatal visit  Plan   g2 Problems (from 11/29/21 to present)     Problem Noted Resolved   Supervision of other normal pregnancy, antepartum 12/13/2021 by JoCleophas DunkerCMA No   Overview Addendum 04/11/2022 11:07 AM by BoChilton GreathouseCMA     Clinical Staff Provider  Office Location  [facility] Dating  07/15/2022, by Last Menstrual Period  Language  English Anatomy [place]  Flu Vaccine  offer Genetic Screen  NIPS: 12/18/2021: Neg/Female  TDaP vaccine   offer Hgb A1C or  GTT Early : Third trimester :   Covid declined   LAB RESULTS   Rhogam  A/Positive/-- (10/11 0954)  Blood Type A/Positive/-- (10/11 [id number]  Feeding Plan breast Antibody Negative (10/11 0954)  Contraception undecided Rubella 2.72 (10/11 0954)  Circumcision no RPR Non Reactive (10/11 0954)   Pediatrician  [facility] HBsAg Negative (10/11 0954)   Support Person EdOphelia CharterIV Non Reactive (10/11 0954)  Prenatal Classes yes Varicella     GBS  (For PCN allergy, check sensitivities)   BTL Consent  Hep C Non Reactive (10/11 0954)   VBAC Consent  Pap Diagnosis  Date Value Ref Range Status  01/10/2019      - Negative for intraepithelial lesion or malignancy (NILM)      Hgb Electro      CF      SMA  Preterm labor symptoms and general obstetric precautions including but not limited to vaginal bleeding, contractions, leaking of fluid and fetal movement were reviewed in detail with the patient. Please refer to After Visit Summary for other counseling recommendations.   Return in about 2 weeks (around 05/13/2022) for 28w labs and  rob.  Rod Can, CNM 04/29/2022 11:50 AM

## 2022-05-02 ENCOUNTER — Other Ambulatory Visit: Payer: Self-pay | Admitting: Obstetrics and Gynecology

## 2022-05-02 ENCOUNTER — Ambulatory Visit: Payer: Managed Care, Other (non HMO) | Attending: Obstetrics and Gynecology | Admitting: Obstetrics and Gynecology

## 2022-05-02 ENCOUNTER — Encounter: Payer: Self-pay | Admitting: *Deleted

## 2022-05-02 ENCOUNTER — Ambulatory Visit: Payer: Managed Care, Other (non HMO) | Admitting: *Deleted

## 2022-05-02 ENCOUNTER — Ambulatory Visit: Payer: Managed Care, Other (non HMO) | Attending: Obstetrics and Gynecology

## 2022-05-02 VITALS — BP 128/66 | HR 87

## 2022-05-02 DIAGNOSIS — Z3A27 27 weeks gestation of pregnancy: Secondary | ICD-10-CM | POA: Diagnosis not present

## 2022-05-02 DIAGNOSIS — O99212 Obesity complicating pregnancy, second trimester: Secondary | ICD-10-CM

## 2022-05-02 DIAGNOSIS — O35AXX9 Maternal care for other (suspected) fetal abnormality and damage, fetal facial anomalies, other fetus: Secondary | ICD-10-CM

## 2022-05-02 DIAGNOSIS — O35AXX0 Maternal care for other (suspected) fetal abnormality and damage, fetal facial anomalies, not applicable or unspecified: Secondary | ICD-10-CM

## 2022-05-02 DIAGNOSIS — O34219 Maternal care for unspecified type scar from previous cesarean delivery: Secondary | ICD-10-CM

## 2022-05-02 DIAGNOSIS — O289 Unspecified abnormal findings on antenatal screening of mother: Secondary | ICD-10-CM | POA: Diagnosis not present

## 2022-05-02 DIAGNOSIS — E669 Obesity, unspecified: Secondary | ICD-10-CM

## 2022-05-02 DIAGNOSIS — O4692 Antepartum hemorrhage, unspecified, second trimester: Secondary | ICD-10-CM

## 2022-05-02 NOTE — Progress Notes (Signed)
Maternal-Fetal Medicine   Name: Saskia Avetisyan DOB: 1995-12-02 MRN: [id number] Referring Provider: Rod Can, CNM  I had the pleasure of [facility]. Newman Pies [facility].  She was accompanied by her husband.  At your office ultrasound performed on 04/09/2022, cleft lip was diagnosed.  Her pregnancy has, otherwise, been uneventful.  On cell-free fetal DNA screening, the risks of fetal aneuploidies are not increased.  Past medical history: No history of diabetes or hypertension or any chronic medical conditions. Past surgical history cesarean section. Medications: Prenatal vitamins. Allergies: No known drug allergies. Social history: Denies tobacco or drug or alcohol use.  She has been married 7 years and her husband is in good health.  Both patient and her husband are Hispanic. Family history: No history of venous thromboembolism in the family.  Obstetric history is significant for a term cesarean delivery in March 2019 (for nonreassuring fetal heart rates) of a female infant weighing 7 pounds and 2 ounces at birth.  Her daughter is in good health.  Ultrasound We performed a fetal anatomical survey.  Amniotic fluid is normal and good fetal activity seen.  Fetal growth is appropriate for gestational age.  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  -Palatal defect is strongly suspected on sagittal views. Alveolar ridge was incompletes raising the possibility of cleft palate. -Profile appears normal with no evidence of micrognathia. -Intracranial structures appear normal. -Rest of the fetal anatomy appears normal. -Placenta is posterior and there is no evidence of previa or placenta accreta spectrum.  Cleft lip and palate: -I explained the findings with help of ultrasound (real-time and still images). -Unilateral cleft lip is present in about 75% of cases and  cleft palate is present in about 75% of these cases. -About 2% to 7% of facial clefts are associated with genetic syndrome. -About 25% (15% to 40%) of cases have associated anomalies. -I reassured the patient of absence of other anomalies. However, I informed her that ultrasound has limitations in detecting anomalies that could be missed. -Although cardiac anatomy appears normal, I recommended fetal echocardiography by pediatric cardiologists. Patient agreed to have fetal echocardiography. -I discussed amniocentesis for fetal karyotype and some (not all) genetic syndromes that could be identified by microarray. I discussed the procedure and possible complications including preterm delivery (1 in 500 procedures). I discussed cell-free fetal DNA screening and its limitations. Patient is undecided about amniocentesis. -I recommended that see our genetic counselor to discuss options. Patient will have consultation with our genetic counselor next week ([facility]). -Patient will be referred to cleft team later in pregnancy (or earlier at her request) to discuss postnatal surgery.  -I briefly discussed neonatal management. Surgery is usually performed before 3 months in isolated cleft lip and in 6 months to a year if cleft palate is confirmed.   Recommendations -Patient has an appointment to meet with the cleft team on 05/07/22 at [facility]. -We have requested an appointment for fetal echocardiography ([facility]). -An appointment was made for her to return in 4 weeks for follow-up growth assessment. -Genetic counseling appointment next week ([facility]).  Thank you for consultation.  If you have any questions or concerns, please contact me the [facility].  Consultation including face-to-face (more than 50%) counseling 45 minutes.

## 2022-05-06 ENCOUNTER — Encounter: Payer: Self-pay | Admitting: Obstetrics and Gynecology

## 2022-05-07 DIAGNOSIS — O35AXX1 Maternal care for other (suspected) fetal abnormality and damage, fetal facial anomalies, fetus 1: Secondary | ICD-10-CM | POA: Insufficient documentation

## 2022-05-07 HISTORY — DX: Maternal care for other (suspected) fetal abnormality and damage, fetal facial anomalies, fetus 1: O35.AXX1

## 2022-05-08 ENCOUNTER — Ambulatory Visit: Payer: Managed Care, Other (non HMO) | Attending: Obstetrics

## 2022-05-08 ENCOUNTER — Other Ambulatory Visit: Payer: Self-pay

## 2022-05-08 ENCOUNTER — Telehealth: Payer: Self-pay

## 2022-05-08 ENCOUNTER — Ambulatory Visit: Payer: Self-pay | Admitting: Obstetrics

## 2022-05-08 DIAGNOSIS — Z3A27 27 weeks gestation of pregnancy: Secondary | ICD-10-CM | POA: Diagnosis not present

## 2022-05-08 DIAGNOSIS — O35AXX0 Maternal care for other (suspected) fetal abnormality and damage, fetal facial anomalies, not applicable or unspecified: Secondary | ICD-10-CM | POA: Diagnosis not present

## 2022-05-08 NOTE — Telephone Encounter (Signed)
Pt reports feeling dizzy a lot, she can not being doing anything even laying down and feels dizzy, drinking plenty of water and eating frequently..I advised pt that dizziness can be normal in pregnancy she wants to eat proteins to keep her blood sugar steady. When she experiences these spells to eat something and drink lay down it could take about 15 mins to pass. She has an appointment on the 4th we can discuss this with the provider then.

## 2022-05-09 ENCOUNTER — Other Ambulatory Visit: Payer: Self-pay

## 2022-05-09 ENCOUNTER — Encounter: Payer: Self-pay | Admitting: Certified Nurse Midwife

## 2022-05-09 DIAGNOSIS — Z3482 Encounter for supervision of other normal pregnancy, second trimester: Secondary | ICD-10-CM

## 2022-05-09 MED ORDER — GOODSENSE PRENATAL VITAMINS 28-0.8 MG PO TABS: 1.0000 | ORAL_TABLET | Freq: Every day | ORAL | 11 refills | Status: DC

## 2022-05-12 ENCOUNTER — Encounter: Payer: Self-pay | Admitting: Obstetrics

## 2022-05-12 ENCOUNTER — Ambulatory Visit (INDEPENDENT_AMBULATORY_CARE_PROVIDER_SITE_OTHER): Payer: Managed Care, Other (non HMO) | Admitting: Obstetrics

## 2022-05-12 ENCOUNTER — Other Ambulatory Visit: Payer: Managed Care, Other (non HMO)

## 2022-05-12 VITALS — BP 112/60 | HR 82 | Wt 207.4 lb

## 2022-05-12 DIAGNOSIS — Z130 Encounter for screening for diseases of the blood and blood-forming organs and certain disorders involving the immune mechanism: Secondary | ICD-10-CM

## 2022-05-12 DIAGNOSIS — Z23 Encounter for immunization: Secondary | ICD-10-CM

## 2022-05-12 DIAGNOSIS — O35AXX0 Maternal care for other (suspected) fetal abnormality and damage, fetal facial anomalies, not applicable or unspecified: Secondary | ICD-10-CM | POA: Insufficient documentation

## 2022-05-12 DIAGNOSIS — Z3A24 24 weeks gestation of pregnancy: Secondary | ICD-10-CM

## 2022-05-12 DIAGNOSIS — Z3482 Encounter for supervision of other normal pregnancy, second trimester: Secondary | ICD-10-CM

## 2022-05-12 DIAGNOSIS — Z131 Encounter for screening for diabetes mellitus: Secondary | ICD-10-CM

## 2022-05-12 DIAGNOSIS — Z3480 Encounter for supervision of other normal pregnancy, unspecified trimester: Secondary | ICD-10-CM

## 2022-05-12 DIAGNOSIS — Z113 Encounter for screening for infections with a predominantly sexual mode of transmission: Secondary | ICD-10-CM

## 2022-05-12 DIAGNOSIS — Z3A28 28 weeks gestation of pregnancy: Secondary | ICD-10-CM

## 2022-05-12 NOTE — Progress Notes (Signed)
Routine Prenatal Care Visit  Subjective  Carly Henry is a 27 y.o. G2P1001 at 16w5dbeing seen today for ongoing prenatal care.  She is currently monitored for the following issues for this high-risk pregnancy and has Chronic migraine w/o aura w/o status migrainosus, not intractable; Generalized anxiety disorder; Obesity (BMI 30-39.9); History of C-section; Incisional pain; Bleeding hemorrhoid; Pelvic pain; Supervision of other normal pregnancy, antepartum; and Fetal cleft lip and palate affecting management of mother in singleton pregnancy, antepartum on their problem list.  ----------------------------------------------------------------------------------- Patient reports no bleeding, no contractions, no cramping, and [facility] and , as her baby has a cleft lip, wants to deliver where there is a cleft team. .   Contractions: Irritability. Vag. Bleeding: None.  Movement: Present. Leaking Fluid denies.  ----------------------------------------------------------------------------------- The following portions of the patient's history were reviewed and updated as appropriate: allergies, current medications, past family history, past medical history, past social history, past surgical history and problem list. Problem list updated.  Objective  Last menstrual period 10/08/2021. Pregravid weight 188 lb (85.3 kg) Total Weight Gain 19 lb 6.4 oz (8.8 kg) Urinalysis: Urine Protein    Urine Glucose    Fetal Status: Fetal Heart Rate (bpm): 145 Fundal Height: 29 cm Movement: Present     General:  Alert, oriented and cooperative. Patient is in no acute distress.  Skin: Skin is warm and dry. No rash noted.   Cardiovascular: Normal heart rate noted  Respiratory: Normal respiratory effort, no problems with respiration noted  Abdomen: Soft, gravid, appropriate for gestational age.  Pain/Pressure: Absent     Pelvic:  Cervical exam deferred        Extremities: Normal range of motion.  Edema: None  Mental Status: Normal mood and affect. Normal behavior. Normal judgment and thought content.   Assessment   27y.o. G2P1001 at 268w5dy  07/30/2022, by Ultrasound presenting for routine prenatal visit  Plan   g2 Problems (from 11/29/21 to present)     Problem Noted Resolved   Supervision of other normal pregnancy, antepartum 12/13/2021 by JoCleophas DunkerCMA No   Overview Addendum 04/11/2022 11:07 AM by BoChilton GreathouseCMA     Clinical Staff Provider  Office Location  [facility] Dating  07/15/2022, by Last Menstrual Period  Language  English Anatomy [place]  Flu Vaccine  offer Genetic Screen  NIPS: 12/18/2021: Neg/Female  TDaP vaccine   offer Hgb A1C or  GTT Early : Third trimester :   Covid declined   LAB RESULTS   Rhogam  A/Positive/-- (10/11 0954)  Blood Type A/Positive/-- (10/11 [id number]  Feeding Plan breast Antibody Negative (10/11 0954)  Contraception undecided Rubella 2.72 (10/11 0954)  Circumcision no RPR Non Reactive (10/11 0954)   Pediatrician  [facility] HBsAg Negative (10/11 0954)   Support Person EdOphelia CharterIV Non Reactive (10/11 0954)  Prenatal Classes yes Varicella     GBS  (For PCN allergy, check sensitivities)   BTL Consent  Hep C Non Reactive (10/11 0954)   VBAC Consent  Pap Diagnosis  Date Value Ref Range Status  01/10/2019      - Negative for intraepithelial lesion or malignancy (NILM)      Hgb Electro      CF      SMA  Preterm labor symptoms and general obstetric precautions including but not limited to vaginal bleeding, contractions, leaking of fluid and fetal movement were reviewed in detail with the patient. Please refer to After Visit Summary for other counseling recommendations.   28 week labs today. We spent extra time discussing her first delivery (emergency CS)   Return in about 1 week (around 05/19/2022)  for return OB and assist with transfer to [facility] for delivery.Imagene Riches, CNM  05/12/2022 11:14 AM

## 2022-05-13 LAB — 28 WEEK RH+PANEL
Basophils Absolute: 0 10*3/uL (ref 0.0–0.2)
Basos: 0 %
EOS (ABSOLUTE): 0.1 10*3/uL (ref 0.0–0.4)
Eos: 1 %
Gestational Diabetes Screen: 136 mg/dL (ref 70–139)
HIV Screen 4th Generation wRfx: NONREACTIVE
Hematocrit: 33.9 % — ABNORMAL LOW (ref 34.0–46.6)
Hemoglobin: 11.2 g/dL (ref 11.1–15.9)
Immature Grans (Abs): 0 10*3/uL (ref 0.0–0.1)
Immature Granulocytes: 0 %
Lymphocytes Absolute: 1.6 10*3/uL (ref 0.7–3.1)
Lymphs: 18 %
MCH: 27.7 pg (ref 26.6–33.0)
MCHC: 33 g/dL (ref 31.5–35.7)
MCV: 84 fL (ref 79–97)
Monocytes Absolute: 0.4 10*3/uL (ref 0.1–0.9)
Monocytes: 4 %
Neutrophils Absolute: 7 10*3/uL (ref 1.4–7.0)
Neutrophils: 77 %
Platelets: 225 10*3/uL (ref 150–450)
RBC: 4.04 x10E6/uL (ref 3.77–5.28)
RDW: 14 % (ref 11.7–15.4)
RPR Ser Ql: NONREACTIVE
WBC: 9.2 10*3/uL (ref 3.4–10.8)

## 2022-05-13 NOTE — Progress Notes (Unsigned)
Virtual Visit via Video Note  I connected with Carly Henry on 05/13/22 at 10:00 AM EST by a video enabled telemedicine application and verified that I am speaking with the correct person using two identifiers.  Location: Patient: home Provider: [facility] [place]   I discussed the limitations of evaluation and management by telemedicine and the availability of in person appointments. The patient expressed understanding and agreed to proceed.  Referring provider: Rubie Maid Length of Consultation: 40 minutes  Carly Henry was referred genetic counseling at [facility] due to the finding of cleft lip on second trimester ultrasound. This note summarizes the information we discussed.  The patient was present at this visit alone.  Cleft Lip on Ultrasound: Carly Henry had an ultrasound performed at [facility] on 05/02/22 which confirmed the pregnancy to be at [redacted] weeks gestation. The detailed fetal anatomy was seen and appeared normal except for the fetal lip. Unilateral right cleft lip and suspected cleft palate were seen at this time.    In normal embryological development, the fetal lip usually closes by 7-[redacted] weeks gestation and the fetal palate usually closes by [redacted] weeks gestation.  When parts of these structures do not fuse as they should, cleft lip and/or palate (CL/P) results.  Approximately 25% (1 in 4) of all cleft lip and/or palate (CL/P) cases are cleft lip only, 50% (1 in 2) are cleft lip and palate, and 25% (1 in 4) are cleft palate only.  The incidence of CL/P varies in different ethnic populations; it occurs in approximately 1 in 47,000 Caucasian births and in approximately 1 in [place] births.  In addition to ethnicity, other factors may increase the chance of a CL/P including some prenatal exposures, alcohol and drug use, cigarette smoking, or folic acid deficiency.  CL/P is most often an isolated  condition, but can be present in combination with other birth defects possibly as part of a genetic syndrome.  It is estimated that 7-13% of individuals with a cleft lip and 11-14% of individuals with a cleft lip and palate are born with additional birth defects.  Many genetic syndromes are associated with cleft lip and/or palate which may not be identified on ultrasound and would not be detected by amniocentesis.  For this reason, a genetics evaluation may be recommended sometime after birth in order to assess for an underlying genetic syndrome.  When there is no syndrome as the cause, then the cleft lip or palate is considered idiopathic and thought to be caused by a combination of genetic and environmental factors, called multifactorial inheritance.    Studies report that once a couple has had one child with an isolated cleft lip and/or palate, the recurrence risk for each future pregnancy is approximately 3-7%.  The recurrence risk may change given a diagnosis of an underlying condition.     At the time of her visit, Carly Henry had already reached out and met with the cleft lip and palate team at [facility]. A lactation consult, should Carly Henry plan to breastfeed, can also be considered.  Surgery is required in all individuals with a cleft lip and/or palate.  Surgical correction is different for each individual and may vary depending on the severity of the cleft.  The prognosis for a good cosmetic and functional repair of an isolated CL/P is excellent.  Otherwise, prognosis is dependent upon the presence of other birth defects present or underlying  condition identified.  As a woman's age increases, so does the chance that her pregnancy may have a chromosome condition.  Chromosomes are the inherited structures that contain our instructions for development.  Each cell of the body usually has 46 chromosomes, matched up into 23 pairs.  The last pair is called the sex chromosomes and  determines gender.  A female has an X and a Y chromosome, while a female has two X chromosomes.  Rarely, when a mother's egg and father's sperm unite, an extra or missing chromosome can be passed on to the baby.  Any change in the number or structure of chromosomes can increase the risk of problems in the physical and mental development of a pregnancy.  We discussed some examples of common chromosome conditions including Down syndrome (Trisomy 21), Edward syndrome (Trisomy 18) and Patau syndrome (Trisomy 13).    The following prenatal screening and testing options for this pregnancy were discussed:   Amniocentesis involves the removal of a small amount of amniotic fluid from the sac surrounding the fetus with the use of a thin needle inserted through the mother's abdomen and uterus.  Ultrasound guidance is used throughout the procedure.  Fetal cells from amniotic fluid are directly evaluated and >99.5% of chromosome conditions and >98% of open neural tube defects can be detected. This procedure is generally performed after the 15th week of pregnancy.  The main risks to this procedure include complications leading to miscarriage in less than 1 in 200 cases (0.5%).  Fetal Echocardiogram uses high frequency sound waves to create an image of the developing fetus.  This ultrasound is performed by a pediatric cardiologist and carefully evaluates the structure of the fetal heart.  Fetal echocardiography is made available due to the increased chance for other birth differences in babies with CL/P.   Prior testing in this pregnancy: We also reviewed that [facility] had cell free fetal DNA testing from maternal blood to determine whether or not the baby may have either Down syndrome, trisomy 6, or trisomy 73.  This test utilizes a maternal blood sample and DNA sequencing technology to isolate circulating cell free fetal DNA from maternal plasma.  The fetal DNA can then be analyzed for DNA sequences that are derived from  the three most common chromosomes involved in aneuploidy, chromosomes 13, 18, and 21.  If the overall amount of DNA is greater than the expected level for any of these chromosomes, aneuploidy is suspected.  While we do not consider it a replacement for invasive testing and karyotype analysis, a negative result from this testing would be reassuring, though not a guarantee of a normal chromosome complement for the baby.  An abnormal result is certainly suggestive of an abnormal chromosome complement, though we would still recommend CVS or amniocentesis to confirm any findings from this testing.  Results were low risk for trisomy 57, 34 and 41 in a female pregnancy.     Given the ultrasound finding, the chance for a chromosome condition in the pregnancy is approximately 4-10%.  The most common chromosome conditions associated with a cleft lip and/or palate include Patau syndrome (Trisomy 13) and Edward syndrome (Trisomy 18), which generally are accompanied by severe heart defects and other ultrasound findings. It is encouraging that these differences were not seen in this baby.  AFP screening for open neural tube defects was also performed and was negative.  Family history: We obtained a detailed family history, as follows:   XXXXX and the patient reported no other  family members with developmental delays, birth defects, cleft lip or palate, or known genetic conditions.    We also obtained a detailed pregnancy history. Carly Henry reported no complications thus far and denied the use of additional prescription medications, recreational drugs, tobacco or alcohol.  It is important to remember that each pregnancy in the general population has a 3% chance for a birth defect that might not be detected prenatally.   We encouraged the patient to contact us with any questions or concerns as they may arise.  We may be reached at [phone].     Plan of Care: Fetal echocardiogram referral has been  placed. [facility] Cleft team consultation - Carly Henry already met with this team and plans to deliver at [facility]. She requested that we email her images from the ultrasound documenting the clefting.  We were unable to send those through MyChart so we emailed them to the patient at [email or web address]. Transfer of care to [facility] requested that we initiate the referral to [facility] so that she can begin connections there to plan for delivery.  I will reach out to [facility] for this purpose. The patient declined amniocentesis to assess for chromosome conditions in the pregnancy.   Wilburt Finlay, MS, CGC   I provided 40 minutes of non-face-to-face time during this encounter.   Donette Larry

## 2022-05-14 ENCOUNTER — Encounter: Payer: Self-pay | Admitting: Obstetrics

## 2022-05-14 DIAGNOSIS — G43909 Migraine, unspecified, not intractable, without status migrainosus: Secondary | ICD-10-CM | POA: Insufficient documentation

## 2022-05-26 ENCOUNTER — Ambulatory Visit: Payer: Managed Care, Other (non HMO)

## 2022-05-27 ENCOUNTER — Encounter: Payer: Managed Care, Other (non HMO) | Admitting: Obstetrics and Gynecology

## 2022-05-27 DIAGNOSIS — Z3480 Encounter for supervision of other normal pregnancy, unspecified trimester: Secondary | ICD-10-CM

## 2022-05-27 DIAGNOSIS — Z3A30 30 weeks gestation of pregnancy: Secondary | ICD-10-CM

## 2022-05-28 ENCOUNTER — Ambulatory Visit (INDEPENDENT_AMBULATORY_CARE_PROVIDER_SITE_OTHER): Payer: Managed Care, Other (non HMO) | Admitting: Obstetrics

## 2022-05-28 VITALS — BP 112/68 | HR 75 | Wt 206.4 lb

## 2022-05-28 DIAGNOSIS — Z3480 Encounter for supervision of other normal pregnancy, unspecified trimester: Secondary | ICD-10-CM

## 2022-05-28 DIAGNOSIS — Z3A31 31 weeks gestation of pregnancy: Secondary | ICD-10-CM

## 2022-05-28 DIAGNOSIS — Z3483 Encounter for supervision of other normal pregnancy, third trimester: Secondary | ICD-10-CM

## 2022-05-28 LAB — POCT URINALYSIS DIPSTICK OB
Bilirubin, UA: NEGATIVE
Glucose, UA: NEGATIVE
Ketones, UA: NEGATIVE
Leukocytes, UA: NEGATIVE
Nitrite, UA: NEGATIVE
POC,PROTEIN,UA: NEGATIVE
Spec Grav, UA: 1.015 (ref 1.010–1.025)
Urobilinogen, UA: 0.2 E.U./dL
pH, UA: 6 (ref 5.0–8.0)

## 2022-05-28 NOTE — Progress Notes (Signed)
Carly Henry at [redacted]w[redacted]d. Denies ctx, LOF, and vaginal bleeding. Reports good fetal movement. Carly Henry has US/consult at [facility] on Friday and plans to birth there d/t cleft palate. She is considering continuing her care at [facility] for now d/t transportation concerns but wants to birth with the [facility] cleft team present. Fetal echo was normal. Reviewed routine prenatal care in 3rd trimester. RTC in 2 weeks.  Lurlean Horns, CNM

## 2022-06-19 ENCOUNTER — Encounter: Payer: Managed Care, Other (non HMO) | Admitting: Obstetrics & Gynecology

## 2022-06-19 DIAGNOSIS — R8271 Bacteriuria: Secondary | ICD-10-CM | POA: Insufficient documentation

## 2022-06-19 HISTORY — DX: Bacteriuria: R82.71

## 2022-07-15 ENCOUNTER — Inpatient Hospital Stay: Admit: 2022-07-15 | Payer: Self-pay

## 2022-07-25 NOTE — Discharge Summary (Signed)
 Obstetrics Postpartum Discharge Summary  Admit Date:  07/23/2022 Discharge Date:  07/26/2022  Primary OB: [facility]  Admission Diagnoses:  27 y.o. G2P1001 at [redacted]w[redacted]d pregnant admitted for elective IOL  Pregnancy / Antepartum complications:    Fetus with suspected cleft lip and palate, antepartum, fetus 1 (HHS-HCC)   Anxiety   Migraine   GBS bacteriuria   Previous cesarean delivery affecting pregnancy, antepartum (HHS-HCC)  Discharge Diagnoses: S/p VBAC, Spontaneous  on Jul 25, 2022   Maternal Gravity and Parity: G2P1001 Gestational Age at Delivery: [redacted]w[redacted]d [facility] and Dating: 07/30/2022, Date entered prior to episode creation)  Delivery Details:   Date of Delivery: Jul 25, 2022  Delivered By: Carly Henry  Attending at Delivery: Olam Joane Fret, MD  Delivery Type: VBAC, Spontaneous  Infant: Liveborn 3.35 kg (7 lb 6.2 oz)   Information for the patient's newborn:  Carly Henry, Carly Girl  Henry [[id number]]  female , Apgars 1 min: 8  / 5 min: 9  Anesthesia: Epidural   Intrapartum complications: None Laceration: Perineal: 2nd  - repaired  / Periurethral: bilateral  - repaired  /     /     /     /     Episiotomy: Episiotomy: None  Placenta: Expressed    Neonatal Course: unremarkable  Hospital Course:   Antepartum/Intrapartum: Presented for elective IOL. Labor was augmented and she progressed to complete along a normal labor trajectory. She delivered a viable female infant via VBAC. Blood loss was 400 ml due to lacerational bleeding which was repaired. Please see delivery note for details.   Postpartum: [name] was transitioned to Postpartum service. Contraception counseling was completed. On day of discharge, she had met all postpartum goals.  Hospital Course by Problem: Acute blood loss anemia:  - EBL 400. Hgb 12.1 --> 9.0 post delivery - Asymptomatic - PO iron   Anxiety - CSW Health Maintenance / Postpartum:  Blood Type:  A Positive  (Rhogam was  not indicated) Pap: NILM (2020) -- Due PP Anticoagulation  VTE Risk: Elevated - Recommend mechanical prophylaxis and early ambulation.  Not indicated  Immunizations: TdaP:  Tdap     Name Date   TDAP (>=25YR) VACCINE (ADACEL/BOOSTRIX) 05/12/2022, 07/18/2020, 02/20/2017     , was not indicated MMR: No results found for: RUBELLA , MMR was not indicated Influenza:  Influenza     Name Date   Influenza IIV4, IM PF (6 mo+) (FLULAVAL/FLUZONE/FLUARIX QUAD) 06/19/2022   Influenza IIV4, IM pres-free 12/26/2016     , was not indicated Gardasil series completed: Unknown COVID vaccine: not given  Feeding Method: breast + formula Contraception: OCP: Progestin only pill prescribed.  Discharge:  Disposition: Home Follow-up appointment: [facility] in 4 week  Future Appointments  Date Time Provider Department Center  01/20/2023  3:30 PM Bounvilay, [place], PA [facility]       Medication List     START taking these medications    acetaminophen  325 MG tablet Commonly known as: TYLENOL  Take 2 tablets (650 mg total) by mouth every 4 (four) hours as needed (Pain unrelieved by ibuprofen  or ibuprofen  contraindicated) for up to 10 days   docusate 100 MG capsule Commonly known as: COLACE Take 1 capsule (100 mg total) by mouth 2 (two) times daily for 10 days   ferrous sulfate  324 mg (65 mg iron) EC tablet Take 1 tablet (324 mg total) by mouth once daily   ibuprofen  600 MG tablet Commonly known as: MOTRIN  Take 1 tablet (  600 mg total) by mouth every 6 (six) hours as needed for up to 10 days   lidocaine  4 % patch Commonly known as: SALONPAS Place 1 patch onto the skin daily for 30 days Apply patch to the most painful area for up to 12 hours in a 24 hours period.   norethindrone 0.35 mg tablet Commonly known as: MICRONOR Take 1 tablet (0.35 mg total) by mouth once daily   prenatal vitamin-iron-FA tablet Commonly known as: PRENATE PLUS Take 1 tablet by mouth once  daily   simethicone  80 MG chewable tablet Commonly known as: MYLICON Take 1 tablet (80 mg total) by mouth 4 (four) times daily as needed for Flatulence (gaseous distention) for up to 10 days       CONTINUE taking these medications    prenatal vit-iron fum-folic ac tablet Commonly known as: PRENAVITE         Where to Get Your Medications     These medications were sent to [id number] - [place], [place] - [address]  [address], [place] [address]    Phone: [phone]  acetaminophen  325 MG tablet docusate 100 MG capsule ferrous sulfate  324 mg (65 mg iron) EC tablet ibuprofen  600 MG tablet lidocaine  4 % patch norethindrone 0.35 mg tablet prenatal vitamin-iron-FA tablet simethicone  80 MG chewable tablet    No opioid prescriptions indicated.   TAMEA HONG, MD 07/26/2022   ------------------------------------------------------------------------------- Attestation signed by Philippa Hadassah Quale, MD at 07/28/2022 10:27 AM Attestation Statement:   This service was rendered under my overall direction and control, and I was immediately available via phone/pager or present on site.  MARIA J SMALL, MD  -------------------------------------------------------------------------------

## 2023-04-29 NOTE — Progress Notes (Signed)
 Carly Henry is a 28 y.o. female who is calling in for a Telehealth visit. A face-to-face video encounter was done using Zoom.  This video encounter was conducted with the patient's (or proxy's) verbal consent via secure, interactive audio and video telecommunications while away from clinic/office/hospital.  The patient (or proxy) was instructed to have this encounter in a suitably private space and to only have persons present to whom they give permission to participate. In addition, patient identity was confirmed by use of name plus two identifiers.  This visit was coded based on medical decision making (MDM).   ---------------------------------------------  She is calling in for a Telehealth visit for symptoms of heart burn particularly when eating and drinking that has been present for the past few days, recently worsened today. She noticed today when she was starting to eat, she was having some chest and throat burning. She does not have symptoms when she is not eating. She denies vomiting, nausea, or abdominal pain. She feels that it takes a while for her to swallow. She denies difficulty swallowing. She has not tried any medication for her symptoms. She is currently on treatment with doxycycline for treatment of ureaplasma (history of recurrent BV). She does not always take medication with full glass of water.   Patient Active Problem List  Diagnosis  . Fetus with suspected cleft lip and palate, antepartum, fetus 1 (HHS-HCC)  . Anxiety  . Migraine  . GBS bacteriuria  . Previous cesarean delivery affecting pregnancy, antepartum (HHS-HCC)  . Encounter for induction of labor (HHS-HCC)    Past Medical History:  Diagnosis Date  . Anxiety 07/04/2020  . Migraine     Current Outpatient Medications  Medication Sig Dispense Refill  . buPROPion (WELLBUTRIN XL) 150 MG XL tablet Take 1 tablet (150 mg total) by mouth once daily 30 tablet 11  . etonogestreL -ethinyl estradioL   (NUVARING) 0.12-0.015 mg/24 hr vaginal ring Place 1 Ring vaginally monthly for 90 days Leave in place for 3 consecutive weeks, then remove for 1 week. 3 Ring 0  . ferrous sulfate  324 mg (65 mg iron) EC tablet Take 1 tablet (324 mg total) by mouth once daily (Patient not taking: Reported on 04/07/2023) 30 tablet 0  . norethindrone (MICRONOR) 0.35 mg tablet Take 1 tablet (0.35 mg total) by mouth once daily (Patient not taking: Reported on 04/07/2023) 90 tablet 0  . polyethylene glycol (MIRALAX) packet Take 1 packet (17 g total) by mouth once daily Mix in 4-8ounces of fluid prior to taking. (Patient not taking: Reported on 04/07/2023) 30 packet 0  . prenatal vit-iron fum-folic ac (PRENAVITE) tablet Take 1 tablet by mouth once daily (Patient not taking: Reported on 08/29/2022)    . prenatal vitamin-iron-FA (PRENATE PLUS) tablet Take 1 tablet by mouth once daily 30 tablet 0   No current facility-administered medications for this visit.    No Known Allergies  Results for orders placed or performed in visit on 04/07/23  Genital Mycoplasmas NAA, Swab - [facility]   Specimen: Blood  Result Value Ref Range   Mycoplasma genitalium NAA - [facility] Negative Negative   Mycoplasma hominis NAA - [facility] Positive (!) Negative   Ureaplasma spp NAA - [facility] Positive (!) Negative   Narrative   Test(s) 180022-Mycoplasma hominis NAA; 180023-Ureaplasma spp NAA was developed and its performance characteristics determined by [facility]. It has not been cleared or approved by the Food and Drug Administration. Performed at:  [address] [place], [place]  [id number] Lab  Director: Frankey Sas MD, Phone:  [phone]  Wet Prep  Result Value Ref Range   Clue Cells, Vaginal Present (!) None Seen   WBC (White Blood Cells), Vaginal Few (!) None Seen   Trichomonas, Vaginal None Seen None Seen   Bacteria, Vaginal Many (!) None Seen   Yeast, Vaginal None Seen None Seen   Narrative   Whiff test  positive  Miscellaneous Testing - [facility]  Result Value Ref Range   See Scanned Result See Results     BP Readings from Last 3 Encounters:  04/07/23 104/66  08/29/22 94/64  07/26/22 104/60    Wt Readings from Last 3 Encounters:  04/07/23 83.7 kg (184 lb 9.6 oz)  08/29/22 86.8 kg (191 lb 6.4 oz)  07/23/22 97.4 kg (214 lb 11.7 oz)      Review of Systems (as otherwise noted in HPI):  Negative for: fever, chills, nausea, or vomiting. Positive for: heart burn   Virtual Physical Exam:  General: Patient is well-groomed, well-nourished, appears stated age, in no acute distress.  Respiratory: Patient is able to speak in complete sentences without obvious shortness of breath. No acute distress, accessory muscle use, or tachypnea.   Psych: Patient is alert and oriented, normal affect.    Assessment/Plan:  1. Esophagitis She has had increasing symptoms of burning in her throat and chest. Symptoms are worse with eating and drinking. She describes more severe symptoms this morning after drinking water and eating sausage with hot sauce. She is still currently on treatment with doxycycline. We discussed altering how she takes the medication, ensure with full glass of water and remaining upright for at least 30 minutes before laying down after taking medication. Symptoms can be related to medication side effect of doxycycline given she reports no history of GERD. We also discussed avoiding food triggers such as acidic foods and spicy foods. Can use OTC treatment for symptomatic relief as needed such as Pepcid, Tums, or Gaviscon for faster acting relief. Monitor for new or worsening symptoms, return precautions discussed.    During this declared [employer] and Covid-19 pandemic, the patient has requested to obtain treatment via telehealth. Both patient and care provider will, using this treatment method, endeavor to provide care and treatment that cannot be done in office.  Under these  emergent conditions it is the best (or only) means of connecting. The patient has been advised of the potential risks and limitations of this mode of treatment (including, but not limited to, the absence of in-person examination) and has agreed to be treated in a remote fashion in spite of them.  Any and all of the patient's/patient's family's questions on this issue have been answered, and I have made no promises or guarantees to the patient.  The patient has also been advised to contact this office for worsening conditions or problems, and seek medical treatment and/or call 911 if the patient deems either necessary.

## 2023-10-06 ENCOUNTER — Ambulatory Visit: Admitting: Licensed Practical Nurse

## 2023-10-06 VITALS — BP 116/77 | HR 78 | Wt 174.0 lb

## 2023-10-06 DIAGNOSIS — N898 Other specified noninflammatory disorders of vagina: Secondary | ICD-10-CM | POA: Diagnosis not present

## 2023-10-06 LAB — POCT URINALYSIS DIPSTICK
Bilirubin, UA: NEGATIVE
Blood, UA: NEGATIVE
Glucose, UA: NEGATIVE
Ketones, UA: POSITIVE
Nitrite, UA: NEGATIVE
Protein, UA: NEGATIVE
Spec Grav, UA: 1.015 (ref 1.010–1.025)
Urobilinogen, UA: 0.2 U/dL
pH, UA: 7 (ref 5.0–8.0)

## 2023-10-06 NOTE — Patient Instructions (Signed)
 Boric Acid Suppositories for Bacterial Vaginosis (BV)/Yeast  Boric acid suppositories can help treat BV or prevent recurrence. You can buy over the counter boric acid capsules or make your own suppositories by putting about 600 mg of boric acid in a size 0 capsule.  Boric acid should only be used vaginally, do not take by mouth.  To treat BV/yeast  Place one capsule in the vagina nightly for 7-10 days.  For prevention of BV/yeast: Take nightly for 7-10 days when symptoms start. If you usually have symptoms after an event such as the end of your period or intercourse/sexual activity you may place one capsule in your vagina for 1-3 days after the event   Treatment for Mycoplasma or Ureaplasma: Doxcycline

## 2023-10-06 NOTE — Progress Notes (Unsigned)
 [facility]   No chief complaint on file. Here with her daughter.   HPI:      Carly Henry is a 28 y.o. G2P1001 whose LMP was No LMP recorded., presents today for vaginal odor and discharge that  has been present for a while. Her discharge is yellow/white with a foul odor. The odor is worse about 1 or 2 days after having IC. She also has vaginal itching that comes and goes. She was treated for mycoplasma and ureaplasma in January. Her husband was not treated at that time. She has had BV in the past. She does have discomfort with urination otherwise denies increased frequency or urgency. She has tried an OTC yeast cream, which made it worse. She does not consume alcohol. She was treated for BV in 04/2022.   She is sexually active with 1 make partner. They are actively trying to conceive, (she did give birth at [facility] 2022-09-04, this baby has passed, the pt wishes to not discuss)     Patient Active Problem List   Diagnosis Date Noted   Fetal cleft lip and palate affecting management of mother in singleton pregnancy, antepartum 05/12/2022   Supervision of other normal pregnancy, antepartum 12/13/2021   Incisional pain 01/10/2019   Bleeding hemorrhoid 01/10/2019   Pelvic pain 01/10/2019   History of C-section 06/04/2017   Obesity (BMI 30-39.9) 12/26/2016   Chronic migraine w/o aura w/o status migrainosus, not intractable 01/26/2016   Generalized anxiety disorder 01/26/2016    Past Surgical History:  Procedure Laterality Date   CESAREAN SECTION N/A 05/28/2017   Procedure: CESAREAN SECTION;  Surgeon: Connell Davies, MD;  Location: [facility] ORS;  Service: Obstetrics;  Laterality: N/A;    Family History  Problem Relation Age of Onset   Gestational diabetes Mother    Migraines Mother    High Cholesterol Mother    High Cholesterol Father    Asthma Brother    Diabetes Maternal Grandmother    Diabetes Maternal Grandfather    Other Maternal Grandfather        kdiney  problem - on dialysis   Hypertension Paternal Grandmother    Hypertension Paternal Grandfather    Breast cancer Neg Hx    Ovarian cancer Neg Hx    Colon cancer Neg Hx     Social History   Socioeconomic History   Marital status: Married    Spouse name: Norman   Number of children: 1   Years of education: 16   Highest education level: Not on file  Occupational History   Occupation: Hydrographic surveyor  Tobacco Use   Smoking status: Never   Smokeless tobacco: Never  Vaping Use   Vaping status: Never Used  Substance and Sexual Activity   Alcohol use: Not Currently    Comment: Rare   Drug use: No   Sexual activity: Yes    Partners: Male    Birth control/protection: Inserts, None    Comment: Nuvaring  Other Topics Concern   Not on file  Social History Narrative   Not on file   Social Drivers of Health   Financial Resource Strain: Low Risk  (01/07/2022)   Received from [facility]   Overall Financial Resource Strain (CARDIA)    Difficulty of Paying Living Expenses: Not very hard  Food Insecurity: No Food Insecurity (01/07/2022)   Received from [facility]   Hunger Vital Sign    Within the past 12 months, you worried that your food  would run out before you got the money to buy more.: Never true    Within the past 12 months, the food you bought just didn't last and you didn't have money to get more.: Never true  Recent Concern: Food Insecurity - Food Insecurity Present (12/13/2021)   Hunger Vital Sign    Worried About Running Out of Food in the Last Year: Sometimes true    Ran Out of Food in the Last Year: Never true  Transportation Needs: No Transportation Needs (07/25/2022)   Received from [facility]   PRAPARE - Transportation    In the past 12 months, has lack of transportation kept you from medical appointments or from getting medications?: No    Lack of Transportation (Non-Medical): No  Physical Activity:  Inactive (12/13/2021)   Exercise Vital Sign    Days of Exercise per Week: 0 days    Minutes of Exercise per Session: 0 min  Stress: No Stress Concern Present (12/13/2021)   [employer] of Occupational Health - Occupational Stress Questionnaire    Feeling of Stress : Not at all  Social Connections: Moderately Integrated (12/13/2021)   Social Connection and Isolation Panel    Frequency of Communication with Friends and Family: More than three times a week    Frequency of Social Gatherings with Friends and Family: Twice a week    Attends Religious Services: 1 to 4 times per year    Active Member of [employer] or Organizations: No    Attends Banker Meetings: Never    Marital Status: Married  Catering manager Violence: Not At Risk (12/13/2021)   Humiliation, Afraid, Rape, and Kick questionnaire    Fear of Current or Ex-Partner: No    Emotionally Abused: No    Physically Abused: No    Sexually Abused: No    Outpatient Medications Prior to Visit  Medication Sig Dispense Refill   Prenatal Vit-Fe Fumarate-FA (GOODSENSE PRENATAL VITAMINS) 28-0.8 MG TABS Take 1 tablet by mouth daily. (Patient not taking: Reported on 10/06/2023) 30 tablet 11   Prenatal Vit-Fe Fumarate-FA (PRENATAL VITAMINS PO) Take 1 capsule by mouth daily. (Patient not taking: Reported on 10/06/2023)     No facility-administered medications prior to visit.      ROS:  Review of Systems see HPI    OBJECTIVE:   Vitals:  BP 116/77 (BP Location: Right Arm, Patient Position: Sitting, Cuff Size: Normal)   Pulse 78   Wt 174 lb (78.9 kg)   BMI 32.88 kg/m   Physical Exam Constitutional:      Appearance: Normal appearance.  Cardiovascular:     Rate and Rhythm: Normal rate.  Genitourinary:    General: Normal vulva.     Comments: SSE: cervix pink, no lesions, small amount of white discharge present. No obvious odor Bimanual exam: uterus non gravid, non tender no masses. Adnexa non tender no masses not  enlarged, no CMT   Musculoskeletal:     Cervical back: Normal range of motion.  Skin:    General: Skin is warm.  Neurological:     General: No focal deficit present.     Mental Status: She is alert.  Psychiatric:        Mood and Affect: Mood normal.        Thought Content: Thought content normal.     Results: No results found for this or any previous visit (from the past 24 hours).   Assessment/Plan: Vaginal odor - Plan: NuSwab VG Plus+Mycoplasmas,NAA, Urine Culture, POCT  Urinalysis Dipstick  Vaginal discharge - Plan: NuSwab VG Plus+Mycoplasmas,NAA, Urine Culture, POCT Urinalysis Dipstick    No orders of the defined types were placed in this encounter.  Reviewed it is possible she has mycoplasma/ureaplasma again as her husband was not treated. Will treat based on results, if mycoplasma/ureaplasma present, rec her husband also be treated with Doxycyline.   Discussed using Boric Acid, information given  May need long term treatment for BV   JINNIE HERO [facility], CNM 10/08/2023 1:18 PM

## 2023-10-07 ENCOUNTER — Encounter: Payer: Self-pay | Admitting: Licensed Practical Nurse

## 2023-10-08 ENCOUNTER — Other Ambulatory Visit: Payer: Self-pay | Admitting: Licensed Practical Nurse

## 2023-10-08 ENCOUNTER — Ambulatory Visit: Payer: Self-pay | Admitting: Licensed Practical Nurse

## 2023-10-08 ENCOUNTER — Encounter: Payer: Self-pay | Admitting: Licensed Practical Nurse

## 2023-10-08 DIAGNOSIS — B9689 Other specified bacterial agents as the cause of diseases classified elsewhere: Secondary | ICD-10-CM

## 2023-10-08 DIAGNOSIS — B3731 Acute candidiasis of vulva and vagina: Secondary | ICD-10-CM

## 2023-10-08 LAB — NUSWAB VG PLUS+MYCOPLASMAS,NAA
Atopobium vaginae: HIGH {score} — AB
Candida albicans, NAA: POSITIVE — AB
Candida glabrata, NAA: NEGATIVE
Chlamydia trachomatis, NAA: NEGATIVE
Mycoplasma genitalium NAA: NEGATIVE
Mycoplasma hominis NAA: NEGATIVE
Neisseria gonorrhoeae, NAA: NEGATIVE
Trich vag by NAA: NEGATIVE
Ureaplasma spp NAA: NEGATIVE

## 2023-10-08 LAB — URINE CULTURE

## 2023-10-08 MED ORDER — FLUCONAZOLE 150 MG PO TABS
150.0000 mg | ORAL_TABLET | ORAL | 0 refills | Status: AC
Start: 2023-10-08 — End: 2023-10-12

## 2023-10-08 MED ORDER — METRONIDAZOLE 500 MG PO TABS: 500.0000 mg | ORAL_TABLET | Freq: Two times a day (BID) | ORAL | 0 refills | Status: DC

## 2023-10-08 NOTE — Progress Notes (Signed)
 Pt seen for vaginal odor, swab shows equivocal for BV and yeast. Will treat. Script sent. Pt called LVM and mychart message sent  Jinnie Cookey, [place]  [facility] 10/08/23  1:54 PM

## 2023-11-19 ENCOUNTER — Ambulatory Visit

## 2023-11-19 VITALS — BP 117/70 | HR 67 | Ht 62.0 in | Wt 178.0 lb

## 2023-11-19 DIAGNOSIS — Z3201 Encounter for pregnancy test, result positive: Secondary | ICD-10-CM

## 2023-11-19 DIAGNOSIS — Z3200 Encounter for pregnancy test, result unknown: Secondary | ICD-10-CM

## 2023-11-19 DIAGNOSIS — N912 Amenorrhea, unspecified: Secondary | ICD-10-CM

## 2023-11-19 LAB — POCT URINE PREGNANCY: Preg Test, Ur: POSITIVE — AB

## 2023-11-19 NOTE — Progress Notes (Signed)
    NURSE VISIT NOTE  Subjective:    Patient ID: Bertie Reyes Zavaleta, female    DOB: [birth year], 28 y.o.   MRN: [id number]  HPI  Patient is a 28 y.o. G90P2001 female who presents for evaluation of amenorrhea. She believes she could be pregnant. Pregnancy is desired. Sexual Activity: single partner, contraception: none. Current symptoms also include: positive home pregnancy test and cramping. Last period was normal.    Objective:    BP 117/70   Pulse 67   Ht 5' 2 (1.575 m)   Wt 178 lb (80.7 kg)   LMP 10/16/2023   BMI 32.56 kg/m   Lab Review  Results for orders placed or performed in visit on 11/19/23  POCT urine pregnancy  Result Value Ref Range   Preg Test, Ur Positive (A) Negative    Assessment:   1. Possible pregnancy     Plan:   Pregnancy Test: Positive  Estimated Date of Delivery: 07/22/24 BP Cuff Measurement taken. Cuff Size Adult Small Encouraged well-balanced diet, plenty of rest when needed, pre-natal vitamins daily and walking for exercise.  Discussed self-help for nausea, avoiding OTC medications until consulting provider or pharmacist, other than Tylenol  as needed, minimal caffeine (1-2 cups daily) and avoiding alcohol.   She will schedule her nurse visit @ 7-[redacted] wks pregnant, u/s for dating @10  wk, and NOB visit at [redacted] wk pregnant.    Feel free to call with any questions.     Harlene Gander, CMA

## 2023-12-11 ENCOUNTER — Telehealth

## 2023-12-11 DIAGNOSIS — Z3480 Encounter for supervision of other normal pregnancy, unspecified trimester: Secondary | ICD-10-CM

## 2023-12-11 NOTE — Patient Instructions (Signed)
 Commonly Asked Questions During Pregnancy First Trimester of Pregnancy  The first trimester of pregnancy starts on the first day of your last monthly period until the end of week 13. This is months 1 through 3 of pregnancy. A week after a sperm fertilizes an egg, the egg will implant into the wall of the uterus and begin to develop into a baby. Body changes during your first trimester Your body goes through many changes during pregnancy. The changes usually return to normal after your baby is born. Physical changes Your breasts may grow larger and may hurt. The area around your nipples may get darker. Your periods will stop. Your hair and nails may grow faster. You may pee more often. Health changes You may tire easily. Your gums may bleed and may be sensitive when you brush and floss. You may not feel hungry. You may have heartburn. You may throw up or feel like you may throw up. You may want to eat some foods, but not others. You may have headaches. You may have trouble pooping (constipation). Other changes Your emotions may change from day to day. You may have more dreams. Follow these instructions at home: Medicines Talk to your health care provider if you're taking medicines. Ask if the medicines are safe to take during pregnancy. Your provider may change the medicines that you take. Do not take any medicines unless told to by your provider. Take a prenatal vitamin that has at least 600 micrograms (mcg) of folic acid. Do not use herbal medicines, illegal substances, or medicines that are not approved by your provider. Eating and drinking While you're pregnant your body needs extra food for your growing baby. Talk with your provider about what to eat while pregnant. Activity Most women are able to exercise during pregnancy. Exercises may need to change as your pregnancy goes on. Talk to your provider about your activities and exercise routines. Relieving pain and  discomfort Wear a good, supportive bra if your breasts hurt. Rest with your legs raised if you have leg cramps or low back pain. Safety Wear your seatbelt at all times when you're in a car. Talk to your provider if someone hits you, hurts you, or yells at you. Talk with your provider if you're feeling sad or have thoughts of hurting yourself. Lifestyle Certain things can be harmful while you're pregnant. Follow these rules: Do not use hot tubs, steam rooms, or saunas. Do not douche. Do not use tampons or scented pads. Do not drink alcohol,smoke, vape, or use products with nicotine  or tobacco in them. If you need help quitting, talk with your provider. Avoid cat litter boxes and soil used by cats. These things carry germs that can cause harm to your pregnancy and your baby. General instructions Keep all follow-up visits. It helps you and your unborn baby stay as healthy as possible. Write down your questions. Take them to your visits. Your provider will: Talk with you about your overall health. Give you advice or refer you to specialists who can help with different needs, including: Prenatal education classes. Mental health and counseling. Foods and healthy eating. Ask for help if you need help with food. Call your dentist and ask to be seen. Brush your teeth with a soft toothbrush. Floss gently. Where to find more information American Pregnancy Association: americanpregnancy.org [employer]: acog.org Office on [employer] Health: [employer] Contact a health care provider if: You feel dizzy, faint, or have a fever. You vomit or have  watery poop (diarrhea) for 2 days or more. You have abnormal discharge or bleeding from your vagina. You have pain when you pee or your pee smells bad. You have cramps, pain, or pressure in your belly area. Get help right away if: You have trouble breathing or chest pain. You have any kind of injury, such as from a  fall or a car crash. These symptoms may be an emergency. Get help right away. Call 911. Do not wait to see if the symptoms will go away. Do not drive yourself to the hospital. This information is not intended to replace advice given to you by your health care provider. Make sure you discuss any questions you have with your health care provider. Document Revised: 11/27/2022 Document Reviewed: 06/27/2022 Elsevier Patient Education  2024 Elsevier Inc. Cats: A parasite can be excreted in cat feces.  To avoid exposure you need to have another person empty the little box.  If you must empty the litter box you will need to wear gloves.  Wash your hands after handling your cat.  This parasite can also be found in raw or undercooked meat so this should also be avoided.  Colds, Sore Throats, Flu: Please check your medication sheet to see what you can take for symptoms.  If your symptoms are unrelieved by these medications please call the office.  Dental Work: Most any dental work Agricultural consultant recommends is permitted.  X-rays should only be taken during the first trimester if absolutely necessary.  Your abdomen should be shielded with a lead apron during all x-rays.  Please notify your provider prior to receiving any x-rays.  Novocaine is fine; gas is not recommended.  If your dentist requires a note from us  prior to dental work please call the office and we will provide one for you.  Exercise: Exercise is an important part of staying healthy during your pregnancy.  You may continue most exercises you were accustomed to prior to pregnancy.  Later in your pregnancy you will most likely notice you have difficulty with activities requiring balance like riding a bicycle.  It is important that you listen to your body and avoid activities that put you at a higher risk of falling.  Adequate rest and staying well hydrated are a must!  If you have questions about the safety of specific activities ask your provider.     Exposure to Children with illness: Try to avoid obvious exposure; report any symptoms to us  when noted,  If you have chicken pos, red measles or mumps, you should be immune to these diseases.   Please do not take any vaccines while pregnant unless you have checked with your OB provider.  Fetal Movement: After 28 weeks we recommend you do kick counts twice daily.  Lie or sit down in a calm quiet environment and count your baby movements kicks.  You should feel your baby at least 10 times per hour.  If you have not felt 10 kicks within the first hour get up, walk around and have something sweet to eat or drink then repeat for an additional hour.  If count remains less than 10 per hour notify your provider.  Fumigating: Follow your pest control agent's advice as to how long to stay out of your home.  Ventilate the area well before re-entering.  Hemorrhoids:   Most over-the-counter preparations can be used during pregnancy.  Check your medication to see what is safe to use.  It is important to use a  stool softener or fiber in your diet and to drink lots of liquids.  If hemorrhoids seem to be getting worse please call the office.   Hot Tubs:  Hot tubs Jacuzzis and saunas are not recommended while pregnant.  These increase your internal body temperature and should be avoided.  Intercourse:  Sexual intercourse is safe during pregnancy as long as you are comfortable, unless otherwise advised by your provider.  Spotting may occur after intercourse; report any bright red bleeding that is heavier than spotting.  Labor:  If you know that you are in labor, please go to the hospital.  If you are unsure, please call the office and let us  help you decide what to do.  Lifting, straining, etc:  If your job requires heavy lifting or straining please check with your provider for any limitations.  Generally, you should not lift items heavier than that you can lift simply with your hands and arms (no back  muscles)  Painting:  Paint fumes do not harm your pregnancy, but may make you ill and should be avoided if possible.  Latex or water based paints have less odor than oils.  Use adequate ventilation while painting.  Permanents & Hair Color:  Chemicals in hair dyes are not recommended as they cause increase hair dryness which can increase hair loss during pregnancy.   Highlighting and permanents are allowed.  Dye may be absorbed differently and permanents may not hold as well during pregnancy.  Sunbathing:  Use a sunscreen, as skin burns easily during pregnancy.  Drink plenty of fluids; avoid over heating.  Tanning Beds:  Because their possible side effects are still unknown, tanning beds are not recommended.  Ultrasound Scans:  Routine ultrasounds are performed at approximately 20 weeks.  You will be able to see your baby's general anatomy an if you would like to know the gender this can usually be determined as well.  If it is questionable when you conceived you may also receive an ultrasound early in your pregnancy for dating purposes.  Otherwise ultrasound exams are not routinely performed unless there is a medical necessity.  Although you can request a scan we ask that you pay for it when conducted because insurance does not cover  patient request scans.  Work: If your pregnancy proceeds without complications you may work until your due date, unless your physician or employer advises otherwise.  Round Ligament Pain/Pelvic Discomfort:  Sharp, shooting pains not associated with bleeding are fairly common, usually occurring in the second trimester of pregnancy.  They tend to be worse when standing up or when you remain standing for long periods of time.  These are the result of pressure of certain pelvic ligaments called round ligaments.  Rest, Tylenol  and heat seem to be the most effective relief.  As the womb and fetus grow, they rise out of the pelvis and the discomfort improves.  Please  notify the office if your pain seems different than that described.  It may represent a more serious condition.  Common Medications Safe in Pregnancy  Acne:      Constipation:  Benzoyl Peroxide     Colace  Clindamycin      Dulcolax Suppository  Topica Erythromycin     Fibercon  Salicylic Acid      Metamucil         Miralax AVOID:        Senakot   Accutane    Cough:  Retin-A       Cough  Drops  Tetracycline      Phenergan  w/ Codeine if Rx  Minocycline      Robitussin (Plain & DM)  Antibiotics:     Crabs/Lice:  Ceclor       RID  Cephalosporins    AVOID:  E-Mycins      Kwell  Keflex  Macrobid/Macrodantin   Diarrhea:  Penicillin      Kao-Pectate  Zithromax      Imodium AD         PUSH FLUIDS AVOID:       Cipro     Fever:  Tetracycline      Tylenol  (Regular or Extra  Minocycline       Strength)  Levaquin      Extra Strength-Do not          Exceed 8 tabs/24 hrs Caffeine:        200mg /day (equiv. To 1 cup of coffee or  approx. 3 12 oz sodas)         Gas: Cold/Hayfever:       Gas-X  Benadryl       Mylicon  Claritin       Phazyme  **Claritin-D        Chlor-Trimeton    Headaches:  Dimetapp      ASA-Free Excedrin  Drixoral-Non-Drowsy     Cold Compress  Mucinex (Guaifenasin)     Tylenol  (Regular or Extra  Sudafed/Sudafed-12 Hour     Strength)  **Sudafed PE Pseudoephedrine   Tylenol  Cold & Sinus     Vicks Vapor Rub  Zyrtec  **AVOID if Problems With Blood Pressure         Heartburn: Avoid lying down for at least 1 hour after meals  Aciphex      Maalox     Rash:  Milk of Magnesia     Benadryl     Mylanta       1% Hydrocortisone Cream  Pepcid  Pepcid Complete   Sleep Aids:  Prevacid      Ambien    Prilosec       Benadryl   Rolaids       Chamomile Tea  Tums (Limit 4/day)     Unisom         Tylenol  PM         Warm milk-add vanilla or  Hemorrhoids:       Sugar for taste  Anusol/Anusol H.C.  (RX: Analapram 2.5%)  Sugar Substitutes:  Hydrocortisone OTC     Ok in  moderation  Preparation H      Tucks        Vaseline lotion applied to tissue with wiping    Herpes:     Throat:  Acyclovir      Oragel  Famvir  Valtrex     Vaccines:         Flu Shot Leg Cramps:       *Gardasil  Benadryl       Hepatitis A         Hepatitis B Nasal Spray:       Pneumovax  Saline Nasal Spray     Polio Booster         Tetanus Nausea:       Tuberculosis test or PPD  Vitamin B6 25 mg TID   AVOID:    Dramamine      *Gardasil  Emetrol       Live Poliovirus  Ginger Root 250 mg QID    MMR (measles, mumps &  High  Complex Carbs @ Bedtime    rebella)  Sea Bands-Accupressure    Varicella (Chickenpox)  Unisom 1/2 tab TID     *No known complications           If received before Pain:         Known pregnancy;   Darvocet       Resume series after  Lortab        Delivery  Percocet    Yeast:   Tramadol      Femstat  Tylenol  3      Gyne-lotrimin  Ultram       Monistat  Vicodin           MISC:         All Sunscreens           Hair Coloring/highlights          Insect Repellant's          (Including DEET)         Mystic Tans

## 2023-12-11 NOTE — Progress Notes (Signed)
 New OB Intake  I connected with  Carly Henry on 12/11/23 at  1:15 PM EDT by MyChart Video Visit and verified that I am speaking with the correct person using two identifiers. Nurse is located at [facility] and pt is located at Work.  I discussed the limitations, risks, security and privacy concerns of performing an evaluation and management service by telephone and the availability of in person appointments. I also discussed with the patient that there may be a patient responsible charge related to this service. The patient expressed understanding and agreed to proceed.  I explained I am completing New OB Intake today. We discussed her EDD of 07/22/24 that is based on LMP of 10/16/23. Pt is G3/P2001. I reviewed her allergies, medications, Medical/Surgical/OB history, and appropriate screenings. There are cats in the home: no. Based on history, this is a/an pregnancy uncomplicated . Her obstetrical history is significant for NA.  Patient Active Problem List   Diagnosis Date Noted   Bleeding hemorrhoid 01/10/2019   Pelvic pain 01/10/2019   History of C-section 06/04/2017   Obesity (BMI 30-39.9) 12/26/2016   Chronic migraine w/o aura w/o status migrainosus, not intractable 01/26/2016   Generalized anxiety disorder 01/26/2016    Concerns addressed today:   Delivery Plans:  Plans to deliver at [facility].  Anatomy US  Explained first scheduled US  will be 12/25/23. Anatomy US  will be scheduled around [redacted] weeks gestational age.  Labs Discussed genetic screening with patient. Patient consents to genetic testing to be drawn at new OB visit. Discussed possible labs to be drawn at new OB appointment.  COVID Vaccine Patient has not had COVID vaccine.   Social Determinants of Health Food Insecurity: denies food insecurity Transportation: Patient denies transportation needs. Childcare: Discussed no children allowed at ultrasound appointments.   First visit review I  reviewed new OB appt with pt. I explained she will have blood work and pap smear/pelvic exam if indicated. Explained pt will be seen by Carly Henry, CNM at first visit; encounter routed to appropriate provider.   Carly Henry, CMA 12/11/2023  1:18 PM

## 2023-12-25 ENCOUNTER — Ambulatory Visit

## 2023-12-25 DIAGNOSIS — Z3A09 9 weeks gestation of pregnancy: Secondary | ICD-10-CM

## 2023-12-25 DIAGNOSIS — O3680X0 Pregnancy with inconclusive fetal viability, not applicable or unspecified: Secondary | ICD-10-CM

## 2023-12-25 DIAGNOSIS — Z3200 Encounter for pregnancy test, result unknown: Secondary | ICD-10-CM

## 2023-12-26 ENCOUNTER — Other Ambulatory Visit: Payer: Self-pay

## 2023-12-26 ENCOUNTER — Emergency Department
Admission: EM | Admit: 2023-12-26 | Discharge: 2023-12-26 | Disposition: A | Discharge status: Home / Self Care | Attending: Emergency Medicine | Admitting: Emergency Medicine

## 2023-12-26 ENCOUNTER — Encounter: Payer: Self-pay | Admitting: Emergency Medicine

## 2023-12-26 ENCOUNTER — Emergency Department

## 2023-12-26 DIAGNOSIS — K529 Noninfective gastroenteritis and colitis, unspecified: Secondary | ICD-10-CM | POA: Insufficient documentation

## 2023-12-26 DIAGNOSIS — Z3A09 9 weeks gestation of pregnancy: Secondary | ICD-10-CM | POA: Diagnosis not present

## 2023-12-26 DIAGNOSIS — O26891 Other specified pregnancy related conditions, first trimester: Secondary | ICD-10-CM

## 2023-12-26 DIAGNOSIS — O99611 Diseases of the digestive system complicating pregnancy, first trimester: Secondary | ICD-10-CM | POA: Insufficient documentation

## 2023-12-26 LAB — LIPASE, BLOOD: Lipase: 26 U/L (ref 11–51)

## 2023-12-26 LAB — COMPREHENSIVE METABOLIC PANEL WITH GFR
ALT: 14 U/L (ref 0–44)
AST: 20 U/L (ref 15–41)
Albumin: 3.5 g/dL (ref 3.5–5.0)
Alkaline Phosphatase: 52 U/L (ref 38–126)
Anion gap: 12 (ref 5–15)
BUN: 12 mg/dL (ref 6–20)
CO2: 23 mmol/L (ref 22–32)
Calcium: 8.5 mg/dL — ABNORMAL LOW (ref 8.9–10.3)
Chloride: 102 mmol/L (ref 98–111)
Creatinine, Ser: 0.42 mg/dL — ABNORMAL LOW (ref 0.44–1.00)
GFR, Estimated: >60 mL/min (ref >60)
Glucose, Bld: 95 mg/dL (ref 70–99)
Potassium: 3.7 mmol/L (ref 3.5–5.1)
Sodium: 137 mmol/L (ref 135–145)
Total Bilirubin: 0.8 mg/dL (ref 0.0–1.2)
Total Protein: 7.4 g/dL (ref 6.5–8.1)

## 2023-12-26 LAB — URINALYSIS, ROUTINE W REFLEX MICROSCOPIC
Bilirubin Urine: NEGATIVE
Glucose, UA: NEGATIVE mg/dL
Hgb urine dipstick: NEGATIVE
Ketones, ur: 5 mg/dL — AB
Nitrite: NEGATIVE
Protein, ur: NEGATIVE mg/dL
Specific Gravity, Urine: 1.026 (ref 1.005–1.030)
pH: 5 (ref 5.0–8.0)

## 2023-12-26 LAB — CBC
HCT: 40.1 % (ref 36.0–46.0)
Hemoglobin: 13.4 g/dL (ref 12.0–15.0)
MCH: 27.1 pg (ref 26.0–34.0)
MCHC: 33.4 g/dL (ref 30.0–36.0)
MCV: 81.2 fL (ref 80.0–100.0)
Platelets: 217 K/uL (ref 150–400)
RBC: 4.94 MIL/uL (ref 3.87–5.11)
RDW: 18.4 % — ABNORMAL HIGH (ref 11.5–15.5)
WBC: 10.1 K/uL (ref 4.0–10.5)
nRBC: 0 % (ref 0.0–0.2)

## 2023-12-26 LAB — HCG, QUANTITATIVE, PREGNANCY: hCG, Beta Chain, Quant, S: 93134 m[IU]/mL — ABNORMAL HIGH (ref <5)

## 2023-12-26 MED ORDER — ACETAMINOPHEN 500 MG PO TABS
1000.0000 mg | ORAL_TABLET | Freq: Once | ORAL | Status: AC
  Administered 2023-12-26: 1000 mg via ORAL
  Filled 2023-12-26: qty 2

## 2023-12-26 MED ORDER — ONDANSETRON 4 MG PO TBDP: 4.0000 mg | ORAL_TABLET | Freq: Three times a day (TID) | ORAL | 0 refills | Status: DC | PRN

## 2023-12-26 MED ORDER — ONDANSETRON HCL 4 MG/2ML IJ SOLN
4.0000 mg | Freq: Once | INTRAMUSCULAR | Status: AC
  Administered 2023-12-26: 4 mg via INTRAVENOUS
  Filled 2023-12-26: qty 2

## 2023-12-26 MED ORDER — LACTATED RINGERS IV BOLUS
1000.0000 mL | Freq: Once | INTRAVENOUS | Status: AC
  Administered 2023-12-26: 1000 mL via INTRAVENOUS

## 2023-12-26 NOTE — ED Notes (Signed)
 G3P2 at 9+[redacted]wk pregnant, had U/S yesterday. Ate sushi last night, 6 episodes of emesis and 3 episodes of diarrhea since then. Pt in no acute distress, respirations unlabored., skin dry.

## 2023-12-26 NOTE — ED Triage Notes (Signed)
 Pt in via POV, reports onset N/V/D last night after having Sushi.  Patient is [redacted] weeks pregnant; was advised per OB to be evaluated here.  Denies any vaginal bleeding.  Ambulatory to triage, NAD noted at this time.

## 2023-12-26 NOTE — ED Provider Notes (Signed)
 [facility] Provider Note    Event Date/Time   First MD Initiated Contact with Patient 12/26/23 [phone]     (approximate)   History   Chief Complaint Emesis and Diarrhea   HPI  Carly Henry is a 28 y.o. female G3P2001 at approximately 9 weeks of pregnancy who presents to the ED complaining of nausea and vomiting.  Patient reports that she has been feeling nauseous with about 7 episodes of vomiting and multiple episodes of diarrhea since last night.  This been associated with crampy pain in her upper abdomen but she denies any blood in her emesis or stool.  She has not had any fevers, flank pain, or dysuria.  She does report eating cooked sushi last night.  She denies any vaginal bleeding or discharge, has not had any issues with her pregnancy thus far and had her first ultrasound yesterday.     Physical Exam   Triage Vital Signs: ED Triage Vitals  Encounter Vitals Group     BP 12/26/23 0756 120/72     Girls Systolic BP Percentile --      Girls Diastolic BP Percentile --      Boys Systolic BP Percentile --      Boys Diastolic BP Percentile --      Pulse Rate 12/26/23 0756 89     Resp 12/26/23 0756 15     Temp 12/26/23 0756 98 F (36.7 C)     Temp Source 12/26/23 0756 Oral     SpO2 12/26/23 0756 100 %     Weight 12/26/23 0757 177 lb (80.3 kg)     Height 12/26/23 0757 5' 2 (1.575 m)     Head Circumference --      Peak Flow --      Pain Score 12/26/23 0757 5     Pain Loc --      Pain Education --      Exclude from Growth Chart --     Most recent vital signs: Vitals:   12/26/23 0930 12/26/23 1030  BP: 106/66 (!) 114/96  Pulse: 80 92  Resp:  16  Temp:    SpO2: 100% 99%    Constitutional: Alert and oriented. Eyes: Conjunctivae are normal. Head: Atraumatic. Nose: No congestion/rhinnorhea. Mouth/Throat: Mucous membranes are moist.  Cardiovascular: Normal rate, regular rhythm. Grossly normal heart sounds.  2+ radial pulses  bilaterally. Respiratory: Normal respiratory effort.  No retractions. Lungs CTAB. Gastrointestinal: Soft and nontender. No distention. Musculoskeletal: No lower extremity tenderness nor edema.  Neurologic:  Normal speech and language. No gross focal neurologic deficits are appreciated.    ED Results / Procedures / Treatments   Labs (all labs ordered are listed, but only abnormal results are displayed) Labs Reviewed  COMPREHENSIVE METABOLIC PANEL WITH GFR - Abnormal; Notable for the following components:      Result Value   Creatinine, Ser 0.42 (*)    Calcium 8.5 (*)    All other components within normal limits  CBC - Abnormal; Notable for the following components:   RDW 18.4 (*)    All other components within normal limits  URINALYSIS, ROUTINE W REFLEX MICROSCOPIC - Abnormal; Notable for the following components:   Color, Urine YELLOW (*)    APPearance HAZY (*)    Ketones, ur 5 (*)    Leukocytes,Ua SMALL (*)    Bacteria, UA RARE (*)    All other components within normal limits  HCG, QUANTITATIVE, PREGNANCY - Abnormal; Notable for the following  components:   hCG, Beta Chain, Quant, S 93,134 (*)    All other components within normal limits  LIPASE, BLOOD    RADIOLOGY OB ultrasound reviewed and interpreted by me with intrauterine pregnancy.  PROCEDURES:  Critical Care performed: No  Procedures   MEDICATIONS ORDERED IN ED: Medications  acetaminophen  (TYLENOL ) tablet 1,000 mg (has no administration in time range)  ondansetron  (ZOFRAN ) injection 4 mg (4 mg Intravenous Given 12/26/23 0836)  lactated ringers  bolus 1,000 mL (1,000 mLs Intravenous New Bag/Given 12/26/23 0836)     IMPRESSION / MDM / ASSESSMENT AND PLAN / ED COURSE  I reviewed the triage vital signs and the nursing notes.                              28 y.o. female G3P2001 at approximately 9 weeks of pregnancy who presents to the ED complaining of nausea, vomiting, diarrhea, and abdominal pain since last  night.  Patient's presentation is most consistent with acute presentation with potential threat to life or bodily function.  Differential diagnosis includes, but is not limited to, gastroenteritis, dehydration, electrolyte abnormality, AKI, UTI, ectopic pregnancy.  Patient nontoxic-appearing and in no acute distress, vital signs are unremarkable.  She does report some crampy pain in her abdomen but has a benign abdominal exam and symptoms seem most consistent with a gastroenteritis.  We will screen labs and check obstetric ultrasound.  She did have an ultrasound performed yesterday that is not read yet, unfortunately reasonable radiology is unable to read this study as it was done by [facility].  We will treat with IV Zofran  and hydrate with IV fluids.  OB ultrasound is reassuring with intrauterine pregnancy and cardiac motion noted.  Labs without significant anemia, leukocytosis, electrolyte abnormality, or AKI.  LFTs and lipase are unremarkable, urinalysis with no signs of infection.  Patient feeling better on reassessment and is tolerating oral intake without difficulty.  She is appropriate for discharge home with outpatient OB follow-up, was counseled to return to the ED for new or worsening symptoms.  Patient agrees with plan.      FINAL CLINICAL IMPRESSION(S) / ED DIAGNOSES   Final diagnoses:  Gastroenteritis  Abdominal pain during pregnancy in first trimester     Rx / DC Orders   ED Discharge Orders          Ordered    ondansetron  (ZOFRAN -ODT) 4 MG disintegrating tablet  Every 8 hours PRN        12/26/23 1105             Note:  This document was prepared using Dragon voice recognition software and may include unintentional dictation errors.   Willo Dunnings, MD 12/26/23 [phone]

## 2023-12-26 NOTE — ED Notes (Signed)
 [employer] not working. Placed IT ticket. Labs sent with chart labels.

## 2023-12-28 ENCOUNTER — Telehealth: Payer: Self-pay

## 2023-12-28 DIAGNOSIS — O219 Vomiting of pregnancy, unspecified: Secondary | ICD-10-CM

## 2023-12-28 MED ORDER — ONDANSETRON 4 MG PO TBDP: 4.0000 mg | ORAL_TABLET | Freq: Three times a day (TID) | ORAL | 1 refills | Status: DC | PRN

## 2023-12-28 NOTE — Telephone Encounter (Signed)
 Patient called to let us  know she was seen in the ED over the weekend for gastroenteritis.  She was vomiting a lot and unable to hold down food/water.  She says they gave her zofran  and IV fluids and she is feeling much better.  She says she is able to drink and eat again, although she still feels nauseous.  She requests more nausea medication. Rx for ondansetron  sent to the [facility] in [place].  She will call back with any other concerns.

## 2024-01-05 ENCOUNTER — Encounter: Admitting: Certified Nurse Midwife

## 2024-01-08 ENCOUNTER — Encounter: Admitting: Certified Nurse Midwife

## 2024-01-13 ENCOUNTER — Ambulatory Visit: Admitting: Obstetrics

## 2024-01-13 ENCOUNTER — Encounter: Payer: Self-pay | Admitting: Obstetrics

## 2024-01-13 ENCOUNTER — Other Ambulatory Visit (HOSPITAL_COMMUNITY)
Admission: RE | Admit: 2024-01-13 | Discharge: 2024-01-13 | Disposition: A | Discharge status: Home / Self Care | Source: Ambulatory Visit | Attending: Obstetrics | Admitting: Obstetrics

## 2024-01-13 VITALS — BP 111/58 | HR 75 | Wt 186.0 lb

## 2024-01-13 DIAGNOSIS — Z131 Encounter for screening for diabetes mellitus: Secondary | ICD-10-CM

## 2024-01-13 DIAGNOSIS — Z130 Encounter for screening for diseases of the blood and blood-forming organs and certain disorders involving the immune mechanism: Secondary | ICD-10-CM

## 2024-01-13 DIAGNOSIS — Z3480 Encounter for supervision of other normal pregnancy, unspecified trimester: Secondary | ICD-10-CM | POA: Diagnosis present

## 2024-01-13 DIAGNOSIS — Z124 Encounter for screening for malignant neoplasm of cervix: Secondary | ICD-10-CM

## 2024-01-13 DIAGNOSIS — Z3A12 12 weeks gestation of pregnancy: Secondary | ICD-10-CM

## 2024-01-13 DIAGNOSIS — Z113 Encounter for screening for infections with a predominantly sexual mode of transmission: Secondary | ICD-10-CM | POA: Diagnosis present

## 2024-01-13 DIAGNOSIS — O09291 Supervision of pregnancy with other poor reproductive or obstetric history, first trimester: Secondary | ICD-10-CM

## 2024-01-13 DIAGNOSIS — Z0283 Encounter for blood-alcohol and blood-drug test: Secondary | ICD-10-CM

## 2024-01-13 DIAGNOSIS — O09299 Supervision of pregnancy with other poor reproductive or obstetric history, unspecified trimester: Secondary | ICD-10-CM | POA: Insufficient documentation

## 2024-01-13 DIAGNOSIS — O34219 Maternal care for unspecified type scar from previous cesarean delivery: Secondary | ICD-10-CM

## 2024-01-13 DIAGNOSIS — E669 Obesity, unspecified: Secondary | ICD-10-CM

## 2024-01-13 DIAGNOSIS — Z98891 History of uterine scar from previous surgery: Secondary | ICD-10-CM

## 2024-01-13 DIAGNOSIS — Z1379 Encounter for other screening for genetic and chromosomal anomalies: Secondary | ICD-10-CM

## 2024-01-13 MED ORDER — FOLIC ACID 1 MG PO TABS: 4.0000 mg | ORAL_TABLET | Freq: Every day | ORAL | 8 refills | Status: AC

## 2024-01-13 NOTE — Progress Notes (Signed)
 OBSTETRIC INITIAL PRENATAL VISIT  Subjective:    Carly Henry is being seen today for her first obstetrical visit.  This is a planned pregnancy. She is a 28 y.o. G37P2001 female at [redacted]w[redacted]d gestation, Estimated Date of Delivery: 07/22/24 with Patient's last menstrual period was 10/16/2023,  consistent with 9 week sono. Her obstetrical history is significant for G1 prior CD x 1 for NRFHT, G2 fetus with cleft lip/palate (low risk NIPT), this infant passed away at 65mo in 08/02/2024due to intestinal malrotation at [facility].  Relationship with FOB: spouse, living together. Patient does intend to breast feed. Pregnancy history fully reviewed.  She is having a lot of anxiety this pregnancy due to the history of her second pregnancy with fetal cleft lip/palate and the anxiety for her surrounding this. She transferred her prenatal care to [facility] due to preference of having the cleft team immediately available; and then the passing of her daughter due to intestinal malrotation at 65mo. That infant was scheduled to have her cleft repaired Aug '24. These circumstances are making her very nervous in her current pregnancy.  OB History  Gravida Para Term Preterm AB Living  3 2 2  0 0 1  SAB IAB Ectopic Multiple Live Births  0 0 0 0 2    # Outcome Date GA Lbr Len/2nd Weight Sex Type Anes PTL Lv  3 Current           2 Term 07/25/22 [redacted]w[redacted]d 02:17 / 01:25 7 lb 6.2 oz (3.35 kg) F VBAC EPI  DEC     Name: Jonella Redditt     Apgar1: 8  Apgar5: 9  1 Term 05/28/17 [redacted]w[redacted]d  7 lb 0.9 oz (3.2 kg) F CS-LTranv Spinal  LIV     Complications: Failure to Progress in Second Stage     Name: Cooper      Apgar1: 9  Apgar5: 9    Gynecologic History:  Last pap smear was in 2024 at [facility]  Results were Normal.   Denies h/o abnormal pap smears in the past.  Denies history of STIs.  Contraception prior to conception: none   Past Medical History:  Diagnosis Date   Fetus with suspected cleft lip and palate, antepartum,  fetus 1 05/07/2022   GBS bacteriuria 06/19/2022   Migraine     Family History  Problem Relation Age of Onset   Gestational diabetes Mother    Migraines Mother    High Cholesterol Mother    High Cholesterol Father    Asthma Brother    Diabetes Maternal Grandmother    Diabetes Maternal Grandfather    Other Maternal Grandfather        kdiney problem - on dialysis   Hypertension Paternal Grandmother    Hypertension Paternal Grandfather    Breast cancer Neg Hx    Ovarian cancer Neg Hx    Colon cancer Neg Hx     Past Surgical History:  Procedure Laterality Date   CESAREAN SECTION N/A 05/28/2017   Procedure: CESAREAN SECTION;  Surgeon: Connell Davies, MD;  Location: [facility] ORS;  Service: Obstetrics;  Laterality: N/A;    Social History   Socioeconomic History   Marital status: Married    Spouse name: Norman   Number of children: 1   Years of education: 16   Highest education level: Not on file  Occupational History   Occupation: hydrographic surveyor   Occupation: Deputy  Tobacco Use   Smoking status: Never   Smokeless tobacco: Never  Vaping Use   Vaping status: Never Used  Substance and Sexual Activity   Alcohol use: Not Currently    Comment: Rare   Drug use: No   Sexual activity: Yes    Partners: Male    Birth control/protection: None  Other Topics Concern   Not on file  Social History Narrative   Not on file   Social Drivers of Health   Financial Resource Strain: Low Risk  (01/07/2022)   Received from [facility]   Overall Financial Resource Strain (CARDIA)    Difficulty of Paying Living Expenses: Not very hard  Food Insecurity: No Food Insecurity (12/11/2023)   Hunger Vital Sign    Worried About Running Out of Food in the Last Year: Never true    Ran Out of Food in the Last Year: Never true  Transportation Needs: No Transportation Needs (12/11/2023)   PRAPARE - Administrator, Civil Service (Medical): No    Lack of  Transportation (Non-Medical): No  Physical Activity: Sufficiently Active (12/11/2023)   Exercise Vital Sign    Days of Exercise per Week: 7 days    Minutes of Exercise per Session: 60 min  Stress: Stress Concern Present (12/11/2023)   [employer] of Occupational Health - Occupational Stress Questionnaire    Feeling of Stress: Very much  Social Connections: Moderately Integrated (12/13/2021)   Social Connection and Isolation Panel    Frequency of Communication with Friends and Family: More than three times a week    Frequency of Social Gatherings with Friends and Family: Twice a week    Attends Religious Services: 1 to 4 times per year    Active Member of [employer] or Organizations: No    Attends Banker Meetings: Never    Marital Status: Married  Catering Manager Violence: Not At Risk (12/11/2023)   Humiliation, Afraid, Rape, and Kick questionnaire    Fear of Current or Ex-Partner: No    Emotionally Abused: No    Physically Abused: No    Sexually Abused: No    Current Outpatient Medications on File Prior to Visit  Medication Sig Dispense Refill   Prenatal Vit-Fe Fumarate-FA (PRENATAL VITAMINS PO) Take 1 capsule by mouth daily.     metroNIDAZOLE  (FLAGYL ) 500 MG tablet Take 1 tablet (500 mg total) by mouth 2 (two) times daily. (Patient not taking: Reported on 01/13/2024) 14 tablet 0   ondansetron  (ZOFRAN -ODT) 4 MG disintegrating tablet Take 1 tablet (4 mg total) by mouth every 8 (eight) hours as needed for nausea or vomiting. (Patient not taking: Reported on 01/13/2024) 12 tablet 0   ondansetron  (ZOFRAN -ODT) 4 MG disintegrating tablet Take 1 tablet (4 mg total) by mouth every 8 (eight) hours as needed for nausea or vomiting. (Patient not taking: Reported on 01/13/2024) 30 tablet 1   Prenatal Vit-Fe Fumarate-FA (GOODSENSE PRENATAL VITAMINS) 28-0.8 MG TABS Take 1 tablet by mouth daily. (Patient not taking: Reported on 01/13/2024) 30 tablet 11   No current facility-administered  medications on file prior to visit.    No Known Allergies   Review of Systems General: Not Present- Fever, Weight Loss and Weight Gain. Skin: Not Present- Rash. HEENT: Not Present- Blurred Vision, Headache and Bleeding Gums. Respiratory: Not Present- Difficulty Breathing. Breast: Not Present- Breast Mass. Cardiovascular: Not Present- Chest Pain, Elevated Blood Pressure, Fainting / Blacking Out and Shortness of Breath. Gastrointestinal: Not Present- Abdominal Pain, Constipation, Nausea and Vomiting. Female Genitourinary: Not Present- Frequency, Painful Urination, Pelvic Pain, Vaginal Bleeding, Vaginal  Discharge, Contractions, regular, Fetal Movements Decreased, Urinary Complaints and Vaginal Fluid. Musculoskeletal: Not Present- Back Pain and Leg Cramps. Neurological: Not Present- Dizziness. Psychiatric: Not Present- Depression.     Objective:   Blood pressure (!) 111/58, pulse 75, weight 186 lb (84.4 kg), last menstrual period 10/16/2023, unknown if currently breastfeeding.  Body mass index is 34.02 kg/m.  General Appearance:    Alert, cooperative, no distress, appears stated age  Head:    Normocephalic, without obvious abnormality, atraumatic  Eyes:    PERRL, conjunctiva/corneas clear, EOM's intact, both eyes  Ears:    Normal external ear canals, both ears  Nose:   Nares normal, septum midline, mucosa normal, no drainage or sinus tenderness  Throat:   Lips, mucosa, and tongue normal; teeth and gums normal  Neck:   Supple, symmetrical, trachea midline, no adenopathy; thyroid : no enlargement/tenderness/nodules; no carotid bruit or JVD  Back:     Symmetric, no curvature, ROM normal, no CVA tenderness  Lungs:     Clear to auscultation bilaterally, respirations unlabored  Chest Wall:    No tenderness or deformity   Heart:    Regular rate and rhythm, S1 and S2 normal, no murmur, rub or gallop  Breast Exam:    No tenderness, masses, or nipple abnormality  Abdomen:     Soft, non-tender,  bowel sounds active all four quadrants, no masses, no organomegaly.   Genitalia:    Pelvic:external genitalia normal, vagina without lesions, discharge, or tenderness, rectovaginal septum  normal. Cervix normal in appearance, no cervical motion tenderness, no adnexal masses or tenderness.  Pregnancy positive findings: uterine enlargement: 12 wk size, nontender.   Rectal:    Normal external sphincter.  No hemorrhoids appreciated. Internal exam not done.   Extremities:   Extremities normal, atraumatic, no cyanosis or edema  Pulses:   2+ and symmetric all extremities  Skin:   Skin color, texture, turgor normal, no rashes or lesions  Lymph nodes:   Cervical, supraclavicular, and axillary nodes normal  Neurologic:   CNII-XII intact, normal strength, sensation and reflexes throughout     Assessment:   1. Supervision of other normal pregnancy, antepartum   2. Screen for STD (sexually transmitted disease)   3. Screening for iron deficiency anemia   4. Screening for diabetes mellitus (DM)   5. Encounter for blood-alcohol and blood-drug test   6. Genetic screening   7. Cervical cancer screening   8. History of C-section   9. Obesity (BMI 30-39.9)     Plan:   Supervision of normal pregnancy  - Initial labs ordered.  - Prenatal vitamins encouraged, is already taking. - Problem list reviewed and updated. - New OB counseling:  Blended model of physician/CNM for clinic and delivery discussed, encouraged pt to see all providers prenatally so she is familiar with her delivery team. The patient has been given an overview regarding routine prenatal care.  Recommendations regarding diet, weight gain, and exercise in pregnancy were given. She has reservations about her first delivery at [facility]. May want to transfer to [facility] at some point, encouraged her to do so as early as possible if that is her preference.  - Prenatal testing, optional genetic testing, and ultrasound use in pregnancy were reviewed.   Traditional genetic screening vs cell-fee DNA genetic screening discussed, including risks and benefits. Panorama ordered.   2. PG-BMI: 31.63 -TSH and PIH baseline labs ordered -Weight goal: 11-20lbs for entire pregnancy -Stay active, daily physical activity encouraged  3. Hx of infant with cleft  lip/palate:  -This infant passed away at 66mo from intestinal malrotation -Increase folic acid intake to 4mg  daily -Will order anatomy US  as detailed with MFM -Recommend she consider AFP testing at next visit  4. Prior CD x 1 with subsequent VBAC -Desires TOLAC this delivery, consent w/physician at 28-32wks   Follow up in 4 weeks.   Estil Mangle, DO [facility]

## 2024-01-14 LAB — CBC/D/PLT+RPR+RH+ABO+RUBIGG...
Antibody Screen: NEGATIVE
Basophils Absolute: 0 x10E3/uL (ref 0.0–0.2)
Basos: 0 %
EOS (ABSOLUTE): 0.3 x10E3/uL (ref 0.0–0.4)
Eos: 4 %
HCV Ab: NONREACTIVE
HIV Screen 4th Generation wRfx: NONREACTIVE
Hematocrit: 37.3 % (ref 34.0–46.6)
Hemoglobin: 11.9 g/dL (ref 11.1–15.9)
Hepatitis B Surface Ag: NEGATIVE
Immature Grans (Abs): 0 x10E3/uL (ref 0.0–0.1)
Immature Granulocytes: 0 %
Lymphocytes Absolute: 1.2 x10E3/uL (ref 0.7–3.1)
Lymphs: 16 %
MCH: 26.7 pg (ref 26.6–33.0)
MCHC: 31.9 g/dL (ref 31.5–35.7)
MCV: 84 fL (ref 79–97)
Monocytes Absolute: 0.7 x10E3/uL (ref 0.1–0.9)
Monocytes: 9 %
Neutrophils Absolute: 5.4 x10E3/uL (ref 1.4–7.0)
Neutrophils: 71 %
Platelets: 216 x10E3/uL (ref 150–450)
RBC: 4.46 x10E6/uL (ref 3.77–5.28)
RDW: 17.2 % — ABNORMAL HIGH (ref 11.7–15.4)
RPR Ser Ql: NONREACTIVE
Rh Factor: POSITIVE
Rubella Antibodies, IGG: 2.34 {index} (ref >0.99)
Varicella zoster IgG: REACTIVE
WBC: 7.7 x10E3/uL (ref 3.4–10.8)

## 2024-01-14 LAB — PROTEIN / CREATININE RATIO, URINE
Creatinine, Urine: 105 mg/dL
Protein, Ur: 7.5 mg/dL
Protein/Creat Ratio: 71 mg/g{creat} (ref 0–200)

## 2024-01-14 LAB — TSH: TSH: 1.46 u[IU]/mL (ref 0.450–4.500)

## 2024-01-14 LAB — HEMOGLOBIN A1C
Est. average glucose Bld gHb Est-mCnc: 94 mg/dL
Hgb A1c MFr Bld: 4.9 % (ref 4.8–5.6)

## 2024-01-14 LAB — COMPREHENSIVE METABOLIC PANEL WITH GFR
ALT: 15 IU/L (ref 0–32)
AST: 12 IU/L (ref 0–40)
Albumin: 3.7 g/dL — ABNORMAL LOW (ref 4.0–5.0)
Alkaline Phosphatase: 57 IU/L (ref 41–116)
BUN/Creatinine Ratio: 21 (ref 9–23)
BUN: 10 mg/dL (ref 6–20)
Bilirubin Total: <0.2 mg/dL (ref 0.0–1.2)
CO2: 20 mmol/L (ref 20–29)
Calcium: 9 mg/dL (ref 8.7–10.2)
Chloride: 103 mmol/L (ref 96–106)
Creatinine, Ser: 0.47 mg/dL — ABNORMAL LOW (ref 0.57–1.00)
Globulin, Total: 2.8 g/dL (ref 1.5–4.5)
Glucose: 76 mg/dL (ref 70–99)
Potassium: 3.8 mmol/L (ref 3.5–5.2)
Sodium: 136 mmol/L (ref 134–144)
Total Protein: 6.5 g/dL (ref 6.0–8.5)
eGFR: 133 mL/min/1.73 (ref >59)

## 2024-01-14 LAB — MICROSCOPIC EXAMINATION
Casts: NONE SEEN /LPF
RBC, Urine: NONE SEEN /HPF (ref 0–2)

## 2024-01-14 LAB — URINALYSIS, ROUTINE W REFLEX MICROSCOPIC
Bilirubin, UA: NEGATIVE
Glucose, UA: NEGATIVE
Ketones, UA: NEGATIVE
Nitrite, UA: NEGATIVE
RBC, UA: NEGATIVE
Specific Gravity, UA: 1.026 (ref 1.005–1.030)
Urobilinogen, Ur: 1 mg/dL (ref 0.2–1.0)
pH, UA: 6.5 (ref 5.0–7.5)

## 2024-01-14 LAB — HCV INTERPRETATION

## 2024-01-15 ENCOUNTER — Ambulatory Visit: Payer: Self-pay | Admitting: Obstetrics

## 2024-01-15 LAB — URINE CULTURE, OB REFLEX

## 2024-01-15 LAB — MONITOR DRUG PROFILE 14(MW)
Amphetamine Scrn, Ur: NEGATIVE ng/mL
BARBITURATE SCREEN URINE: NEGATIVE ng/mL
BENZODIAZEPINE SCREEN, URINE: NEGATIVE ng/mL
Buprenorphine, Urine: NEGATIVE ng/mL
CANNABINOIDS UR QL SCN: NEGATIVE ng/mL
Cocaine (Metab) Scrn, Ur: NEGATIVE ng/mL
Creatinine(Crt), U: 107.7 mg/dL (ref 20.0–300.0)
Fentanyl, Urine: NEGATIVE pg/mL
Meperidine Screen, Urine: NEGATIVE ng/mL
Methadone Screen, Urine: NEGATIVE ng/mL
OXYCODONE+OXYMORPHONE UR QL SCN: NEGATIVE ng/mL
Opiate Scrn, Ur: NEGATIVE ng/mL
Ph of Urine: 5.2 (ref 4.5–8.9)
Phencyclidine Qn, Ur: NEGATIVE ng/mL
Propoxyphene Scrn, Ur: NEGATIVE ng/mL
SPECIFIC GRAVITY: 1.024
Tramadol Screen, Urine: NEGATIVE ng/mL

## 2024-01-15 LAB — CERVICOVAGINAL ANCILLARY ONLY
Chlamydia: NEGATIVE
Comment: NEGATIVE
Comment: NORMAL
Neisseria Gonorrhea: NEGATIVE

## 2024-01-15 LAB — NICOTINE SCREEN, URINE: Cotinine Ql Scrn, Ur: NEGATIVE ng/mL

## 2024-01-15 LAB — CULTURE, OB URINE

## 2024-01-21 NOTE — Telephone Encounter (Signed)
 Patient also left a voice msg on triage, stated she had just finished reading msg from RN. Her sister in law has picked her up from work and she has decided to go home lay down/nap and if she continues to feel the same way or worse, will head to ED.

## 2024-01-22 LAB — PANORAMA PRENATAL TEST FULL PANEL:PANORAMA TEST PLUS 5 ADDITIONAL MICRODELETIONS: FETAL FRACTION: 7.3

## 2024-02-09 ENCOUNTER — Ambulatory Visit: Admitting: Obstetrics

## 2024-02-09 VITALS — BP 106/51 | HR 81 | Wt 187.0 lb

## 2024-02-09 DIAGNOSIS — Z1379 Encounter for other screening for genetic and chromosomal anomalies: Secondary | ICD-10-CM

## 2024-02-09 DIAGNOSIS — E669 Obesity, unspecified: Secondary | ICD-10-CM | POA: Diagnosis not present

## 2024-02-09 DIAGNOSIS — O09299 Supervision of pregnancy with other poor reproductive or obstetric history, unspecified trimester: Secondary | ICD-10-CM

## 2024-02-09 DIAGNOSIS — O99212 Obesity complicating pregnancy, second trimester: Secondary | ICD-10-CM | POA: Diagnosis not present

## 2024-02-09 DIAGNOSIS — O09292 Supervision of pregnancy with other poor reproductive or obstetric history, second trimester: Secondary | ICD-10-CM | POA: Diagnosis not present

## 2024-02-09 DIAGNOSIS — Z3A16 16 weeks gestation of pregnancy: Secondary | ICD-10-CM

## 2024-02-09 DIAGNOSIS — Z3480 Encounter for supervision of other normal pregnancy, unspecified trimester: Secondary | ICD-10-CM

## 2024-02-09 DIAGNOSIS — Z98891 History of uterine scar from previous surgery: Secondary | ICD-10-CM

## 2024-02-09 DIAGNOSIS — O34219 Maternal care for unspecified type scar from previous cesarean delivery: Secondary | ICD-10-CM

## 2024-02-09 NOTE — Progress Notes (Signed)
    Return Prenatal Note   Subjective  28 y.o. G3P2001 at [redacted]w[redacted]d presents for this follow-up prenatal visit.   Patient states she feels baby flutters. Anatomy US  scheduled on 03/17/24 with MFM, detailed for hx of cleft. Would like AFP testing today. Is taking 4mg  of Folic acid  daily, initially had GI SE, but now tolerating.   Patient reports: Movement: Absent Contractions: Not present Denies vaginal bleeding or leaking fluid. Objective  Flow sheet Vitals: Pulse Rate: 81 BP: (!) 106/51 Fetal Heart Rate (bpm): 150s (BSUS) Total weight gain: 14 lb (6.35 kg)  General Appearance  No acute distress, well appearing, and well nourished Pulmonary   Normal work of breathing Neurologic   Alert and oriented to person, place, and time Psychiatric   Mood and affect within normal limits   Assessment/Plan   Plan  28 y.o. G3P2001 at [redacted]w[redacted]d by LMP=9wk US  presents for follow-up OB visit. Reviewed prenatal record including previous visit note.  1. Supervision of other normal pregnancy, antepartum -AFP done today -FHR in 150s by BSUS, unable to auscultate with doppler  2. Obesity (BMI 30-39.9) -PG-BMI 31.6, TWG 14#, cautioned -Initial baseline labs wnl -Weight gain goal: 11-20lbs total  3. History of C-section -Will need TOLAC consent 28-30wks with physician  4. H/O fetal cleft lip/palate in prior pregnancy, currently pregnant -Continue increased folic acid  -Detailed anatomy US  with MFM scheduled 03/17/24  Orders Placed This Encounter  Procedures   AFP, Serum, Open Spina Bifida    Is patient insulin dependent?:   No    Gestational Age (GA), weeks:   16.4    Date on which patient was at this GA:   02/09/2024    GA Calculation Method:   LMP    Number of fetuses:   1    Reason for screen:   FHX NTD    Donor egg?:   N   Return in about 4 weeks (around 03/08/2024) for ROB.   Future Appointments  Date Time Provider Department Center  03/08/2024  3:35 PM Sebastian Sham, CNM [facility] None   03/17/2024  1:00 PM [facility] PROVIDER 1 [facility]  03/17/2024  1:30 PM [facility] US1 [facility]   For next visit:  continue with routine prenatal care   Estil Mangle, DO [facility] [place]

## 2024-02-10 ENCOUNTER — Encounter: Admitting: Licensed Practical Nurse

## 2024-02-11 LAB — AFP, SERUM, OPEN SPINA BIFIDA
AFP MoM: 0.93
AFP Value: 28 ng/mL
Gest. Age on Collection Date: 16.4 wk
Maternal Age At EDD: 29.4 a
OSBR Risk 1 IN: 10000
Test Results:: NEGATIVE
Weight: 187 [lb_av]

## 2024-02-15 ENCOUNTER — Ambulatory Visit: Payer: Self-pay | Admitting: Obstetrics

## 2024-02-18 ENCOUNTER — Emergency Department

## 2024-02-18 ENCOUNTER — Other Ambulatory Visit: Payer: Self-pay

## 2024-02-18 ENCOUNTER — Emergency Department
Admission: EM | Admit: 2024-02-18 | Discharge: 2024-02-18 | Disposition: A | Discharge status: Home / Self Care | Attending: Emergency Medicine | Admitting: Emergency Medicine

## 2024-02-18 DIAGNOSIS — O26892 Other specified pregnancy related conditions, second trimester: Secondary | ICD-10-CM | POA: Diagnosis present

## 2024-02-18 DIAGNOSIS — Z3A18 18 weeks gestation of pregnancy: Secondary | ICD-10-CM | POA: Diagnosis not present

## 2024-02-18 DIAGNOSIS — O2342 Unspecified infection of urinary tract in pregnancy, second trimester: Secondary | ICD-10-CM | POA: Insufficient documentation

## 2024-02-18 DIAGNOSIS — N390 Urinary tract infection, site not specified: Secondary | ICD-10-CM

## 2024-02-18 LAB — BASIC METABOLIC PANEL WITH GFR
Anion gap: 12 (ref 5–15)
BUN: 6 mg/dL (ref 6–20)
CO2: 19 mmol/L — ABNORMAL LOW (ref 22–32)
Calcium: 8.6 mg/dL — ABNORMAL LOW (ref 8.9–10.3)
Chloride: 105 mmol/L (ref 98–111)
Creatinine, Ser: 0.37 mg/dL — ABNORMAL LOW (ref 0.44–1.00)
GFR, Estimated: >60 mL/min (ref >60)
Glucose, Bld: 72 mg/dL (ref 70–99)
Potassium: 4.1 mmol/L (ref 3.5–5.1)
Sodium: 136 mmol/L (ref 135–145)

## 2024-02-18 LAB — URINALYSIS, ROUTINE W REFLEX MICROSCOPIC
Bilirubin Urine: NEGATIVE
Glucose, UA: NEGATIVE mg/dL
Hgb urine dipstick: NEGATIVE
Ketones, ur: NEGATIVE mg/dL
Nitrite: NEGATIVE
Protein, ur: NEGATIVE mg/dL
Specific Gravity, Urine: 1.004 — ABNORMAL LOW (ref 1.005–1.030)
pH: 7 (ref 5.0–8.0)

## 2024-02-18 LAB — CBC WITH DIFFERENTIAL/PLATELET
Abs Immature Granulocytes: 0.04 K/uL (ref 0.00–0.07)
Basophils Absolute: 0 K/uL (ref 0.0–0.1)
Basophils Relative: 0 %
Eosinophils Absolute: 0.1 K/uL (ref 0.0–0.5)
Eosinophils Relative: 1 %
HCT: 36.1 % (ref 36.0–46.0)
Hemoglobin: 12 g/dL (ref 12.0–15.0)
Immature Granulocytes: 0 %
Lymphocytes Relative: 19 %
Lymphs Abs: 1.7 K/uL (ref 0.7–4.0)
MCH: 27.7 pg (ref 26.0–34.0)
MCHC: 33.2 g/dL (ref 30.0–36.0)
MCV: 83.4 fL (ref 80.0–100.0)
Monocytes Absolute: 0.5 K/uL (ref 0.1–1.0)
Monocytes Relative: 6 %
Neutro Abs: 6.8 K/uL (ref 1.7–7.7)
Neutrophils Relative %: 74 %
Platelets: 174 K/uL (ref 150–400)
RBC: 4.33 MIL/uL (ref 3.87–5.11)
RDW: 15 % (ref 11.5–15.5)
Smear Review: NORMAL
WBC: 9.1 K/uL (ref 4.0–10.5)
nRBC: 0 % (ref 0.0–0.2)

## 2024-02-18 LAB — POC URINE PREG, ED: Preg Test, Ur: POSITIVE — AB

## 2024-02-18 NOTE — ED Provider Notes (Signed)
 [facility] Provider Note    Event Date/Time   First MD Initiated Contact with Patient 02/18/24 0840     (approximate)   History   Flank Pain   HPI  Carly Henry is a 28 y.o. female currently estimated to be 17 and [redacted] weeks pregnant who presents today for evaluation of left-sided flank pain.  Patient reports that she has has frequent urinary tract infections and this feels similar.  She reports that she feels frustrated that getting so many urinary tract infections.  No fevers or chills.  She feels that the pain radiated down to her left lower quadrant.  No nausea or vomiting.  No diarrhea.  No vaginal discharge or bleeding.  Patient Active Problem List   Diagnosis Date Noted   H/O fetal cleft lip/palate in prior pregnancy, currently pregnant 01/13/2024   Migraine 05/14/2022   Supervision of other normal pregnancy, antepartum 12/13/2021   Anxiety 07/04/2020   Bleeding hemorrhoid 01/10/2019   Pelvic pain 01/10/2019   History of C-section 06/04/2017   Obesity (BMI 30-39.9) 12/26/2016   Chronic migraine w/o aura w/o status migrainosus, not intractable 01/26/2016   Generalized anxiety disorder 01/26/2016          Physical Exam   Triage Vital Signs: ED Triage Vitals  Encounter Vitals Group     BP 02/18/24 0821 119/62     Girls Systolic BP Percentile --      Girls Diastolic BP Percentile --      Boys Systolic BP Percentile --      Boys Diastolic BP Percentile --      Pulse Rate 02/18/24 0821 72     Resp 02/18/24 0821 17     Temp 02/18/24 0821 (!) 97.5 F (36.4 C)     Temp Source 02/18/24 0821 Oral     SpO2 02/18/24 0821 99 %     Weight 02/18/24 0823 184 lb (83.5 kg)     Height 02/18/24 0823 5' 1 (1.549 m)     Head Circumference --      Peak Flow --      Pain Score 02/18/24 0821 5     Pain Loc --      Pain Education --      Exclude from Growth Chart --     Most recent vital signs: Vitals:   02/18/24 0821 02/18/24 0834  BP:  119/62   Pulse: 72   Resp: 17   Temp: (!) 97.5 F (36.4 C)   SpO2: 99% 99%    Physical Exam Vitals and nursing note reviewed.  Constitutional:      General: Awake and alert. No acute distress.    Appearance: Normal appearance. The patient is normal weight.  HENT:     Head: Normocephalic and atraumatic.     Mouth: Mucous membranes are moist.  Eyes:     General: PERRL. Normal EOMs        Right eye: No discharge.        Left eye: No discharge.     Conjunctiva/sclera: Conjunctivae normal.  Cardiovascular:     Rate and Rhythm: Normal rate and regular rhythm.     Pulses: Normal pulses.  Pulmonary:     Effort: Pulmonary effort is normal. No respiratory distress.     Breath sounds: Normal breath sounds.  Abdominal:     Abdomen is gravid. There is no abdominal tenderness. No rebound or guarding. No distention.  No CVA tenderness Musculoskeletal:  General: No swelling. Normal range of motion.     Cervical back: Normal range of motion and neck supple.  Skin:    General: Skin is warm and dry.     Capillary Refill: Capillary refill takes less than 2 seconds.     Findings: No rash.  Neurological:     Mental Status: The patient is awake and alert.      ED Results / Procedures / Treatments   Labs (all labs ordered are listed, but only abnormal results are displayed) Labs Reviewed  URINALYSIS, ROUTINE W REFLEX MICROSCOPIC - Abnormal; Notable for the following components:      Result Value   Color, Urine STRAW (*)    APPearance HAZY (*)    Specific Gravity, Urine 1.004 (*)    Leukocytes,Ua SMALL (*)    Bacteria, UA RARE (*)    All other components within normal limits  BASIC METABOLIC PANEL WITH GFR - Abnormal; Notable for the following components:   CO2 19 (*)    Creatinine, Ser 0.37 (*)    Calcium 8.6 (*)    All other components within normal limits  POC URINE PREG, ED - Abnormal; Notable for the following components:   Preg Test, Ur POSITIVE (*)    All other  components within normal limits  URINE CULTURE  CBC WITH DIFFERENTIAL/PLATELET     EKG     RADIOLOGY I independently reviewed and interpreted imaging and agree with radiologists findings.     PROCEDURES:  Critical Care performed:   Procedures   MEDICATIONS ORDERED IN ED: Medications - No data to display   IMPRESSION / MDM / ASSESSMENT AND PLAN / ED COURSE  I reviewed the triage vital signs and the nursing notes.   Differential diagnosis includes, but is not limited to, urinary tract infection, kidney stone, pyelonephritis, round ligament pain.  It is awake and alert, hemodynamically stable and afebrile.  She is nontoxic in appearance.  Abdomen is soft and nontender, no CVA tenderness.  Urinalysis does reveal leukocytes and bacteria consistent with possible urinary tract infection.  No fever or constitutional symptoms, no CVA tenderness to suggest pyelonephritis.  No hematuria, no hydronephrosis noted on renal ultrasound, doubt obstructive uropathy.  She was offered analgesia but declined.  Urine was sent for culture.  Will treat with Keflex.  Recommended close outpatient follow-up with OB/GYN, and strict return precautions.  Patient understands and agrees with plan.  She was discharged in stable condition.   Patient's presentation is most consistent with acute complicated illness / injury requiring diagnostic workup.     FINAL CLINICAL IMPRESSION(S) / ED DIAGNOSES   Final diagnoses:  Lower urinary tract infectious disease  Urinary tract infection in mother during second trimester of pregnancy     Rx / DC Orders   ED Discharge Orders          Ordered    cephALEXin (KEFLEX) 500 MG capsule  4 times daily        02/18/24 1114             Note:  This document was prepared using Dragon voice recognition software and may include unintentional dictation errors.   Terrian Ridlon E, PA-C 02/18/24 1142    Waymond Lorelle Cummins, MD 02/18/24 1314

## 2024-02-18 NOTE — ED Triage Notes (Addendum)
 Pt arrives via POV with c/o left sided flank pain that started last night. Pt states that it is real sharp and had been traveling to her back and now this morning it is radiating down to their left hip/groin area. Pt denies pain/burning with urination. Pt is [redacted] weeks pregnant. Pt is A&Ox4 and ambulatory in triage.

## 2024-02-18 NOTE — Discharge Instructions (Signed)
 Please follow-up with your OB/GYN.  Please take antibiotics as prescribed.  Please return for any new, worsening, or changing symptoms or other concerns.  It was a pleasure caring for you today.

## 2024-02-19 ENCOUNTER — Telehealth: Payer: Self-pay

## 2024-02-19 NOTE — Telephone Encounter (Signed)
 Pt went to [facility] as advised

## 2024-02-20 LAB — URINE CULTURE: Culture: 20000 — AB

## 2024-03-05 ENCOUNTER — Encounter: Payer: Self-pay | Admitting: Obstetrics

## 2024-03-08 ENCOUNTER — Ambulatory Visit: Admitting: Certified Nurse Midwife

## 2024-03-08 ENCOUNTER — Encounter: Admitting: Certified Nurse Midwife

## 2024-03-08 ENCOUNTER — Other Ambulatory Visit (HOSPITAL_COMMUNITY)
Admission: RE | Admit: 2024-03-08 | Discharge: 2024-03-08 | Disposition: A | Discharge status: Home / Self Care | Source: Ambulatory Visit | Attending: Certified Nurse Midwife | Admitting: Certified Nurse Midwife

## 2024-03-08 VITALS — BP 99/66 | HR 68 | Wt 191.8 lb

## 2024-03-08 DIAGNOSIS — N898 Other specified noninflammatory disorders of vagina: Secondary | ICD-10-CM | POA: Insufficient documentation

## 2024-03-08 DIAGNOSIS — Z3480 Encounter for supervision of other normal pregnancy, unspecified trimester: Secondary | ICD-10-CM

## 2024-03-08 DIAGNOSIS — O23593 Infection of other part of genital tract in pregnancy, third trimester: Secondary | ICD-10-CM | POA: Diagnosis not present

## 2024-03-08 DIAGNOSIS — Z0289 Encounter for other administrative examinations: Secondary | ICD-10-CM

## 2024-03-08 DIAGNOSIS — Z3A28 28 weeks gestation of pregnancy: Secondary | ICD-10-CM | POA: Diagnosis not present

## 2024-03-08 DIAGNOSIS — N760 Acute vaginitis: Secondary | ICD-10-CM | POA: Insufficient documentation

## 2024-03-08 MED ORDER — MICONAZOLE NITRATE 2 % VA CREA: 1.0000 | TOPICAL_CREAM | Freq: Every day | VAGINAL | 2 refills | Status: DC

## 2024-03-08 NOTE — Patient Instructions (Signed)
 Second Trimester of Pregnancy  The second trimester of pregnancy is from week 14 through week 27. This is months 4 through 6 of pregnancy. During the second trimester: Morning sickness is less or has stopped. You may have more energy. You may feel hungry more often. At this time, your unborn baby is growing very fast. At the end of the sixth month, the unborn baby may be up to 12 inches long and weigh about 1 pounds. You will likely start to feel the baby move between 16 and 20 weeks of pregnancy. Body changes during your second trimester Your body continues to change during this time. The changes usually go away after your baby is born. Physical changes You will gain more weight. Your belly will get bigger. You may begin to get stretch marks on your hips, belly, and breasts. Your breasts will keep growing and may hurt. You may get dark spots or blotches on your face. A dark line from your belly button to the pubic area may appear. This line is called linea nigra. Your hair may grow faster and get thicker. Health changes You may have headaches. You may have heartburn. You may pee more often. You may have swollen, bulging veins (varicose veins). You may have trouble pooping (constipation), or swollen veins in the butt that can itch or get painful (hemorrhoids). You may have back pain. This is caused by: Weight gain. Pregnancy hormones that are relaxing the joints in your pelvis. Follow these instructions at home: Medicines Talk to your health care provider if you're taking medicines. Ask if the medicines are safe to take during pregnancy. Your provider may change the medicines that you take. Do not take any medicines unless told to by your provider. Take a prenatal vitamin that has at least 600 micrograms (mcg) of folic acid. Do not use herbal medicines, illegal drugs, or medicines that are not approved by your provider. Eating and drinking While you're pregnant your body needs  extra food for your growing baby. Talk with your provider about what to eat while pregnant. Activity Most women are able to exercise during pregnancy. Exercises may need to change as your pregnancy goes on. Talk to your provider about your activities and exercise routines. Relieving pain and discomfort Wear a good, supportive bra if your breasts hurt. Rest with your legs raised if you have leg cramps or low back pain. Take warm sitz baths to soothe pain from hemorrhoids. Use hemorrhoid cream if your provider says it's okay. Do not douche. Do not use tampons or scented pads. Do not use hot tubs, steam rooms, or saunas. Safety Wear your seatbelt at all times when you're in a car. Talk to your provider if someone hits you, hurts you, or yells at you. Talk with your provider if you're feeling sad or have thoughts of hurting yourself. Lifestyle Certain things can be harmful while you're pregnant. It's best to avoid the following: Do not drink alcohol,smoke, vape, or use products with nicotine or tobacco in them. If you need help quitting, talk with your provider. Avoid cat litter boxes and soil used by cats. These things carry germs that can cause harm to your pregnancy and your baby. General instructions Keep all follow-up visits. It helps you and your unborn baby stay as healthy as possible. Write down your questions. Take them to your prenatal visits. Your provider will: Talk with you about your overall health. Give you advice or refer you to specialists who can help with different needs,  including: Prenatal education classes. Mental health and counseling. Foods and healthy eating. Ask for help if you need help with food. Where to find more information American Pregnancy Association: americanpregnancy.org [employer]: acog.org Office on [employer] Health: [employer] Contact a health care provider if: You have a headache that does not go away  when you take medicine. You have any of these problems: You can't eat or drink. You throw up or feel like you may throw up. You have watery poop (diarrhea) for 2 days or more. You have pain when you pee or your pee smells bad. You have been sick for 2 days or more and are not getting better. Contact your provider right away if: You have any of these coming from your vagina: Abnormal discharge. Bad-smelling fluid. Bleeding. Your baby is moving less than usual. You have contractions, belly cramping, or have pain in your pelvis or lower back. You have symptoms of high blood pressure or preeclampsia. These include: A severe, throbbing headache that does not go away. Sudden or extreme swelling of your face, hands, legs, or feet. Vision problems: You see spots. You have blurry vision. Your eyes are sensitive to light. If you can't reach the provider, go to an urgent care or emergency room. Get help right away if: You faint, become confused, or can't think clearly. You have chest pain or trouble breathing. You have any kind of injury, such as from a fall or a car crash. These symptoms may be an emergency. Call 911 right away. Do not wait to see if the symptoms will go away. Do not drive yourself to the hospital. This information is not intended to replace advice given to you by your health care provider. Make sure you discuss any questions you have with your health care provider. Document Revised: 11/27/2022 Document Reviewed: 06/27/2022 Elsevier Patient Education  2024 [employer].

## 2024-03-08 NOTE — Assessment & Plan Note (Signed)
 Miconazole prescription provided, self swab today & will follow up in MyChart with results.

## 2024-03-08 NOTE — Assessment & Plan Note (Addendum)
 Red flag warning signs & how to contact provider reviewed. MFM anatomy u/s scheduled 1/8. Reassurance offered regarding irregular movement at this point in pregnancy and that pattern should develop within next 4-6w.

## 2024-03-08 NOTE — Progress Notes (Signed)
" ° ° °  Return Prenatal Note   Subjective   28 y.o. G3P2001 at [redacted]w[redacted]d presents for this follow-up prenatal visit.  Patient reports recently treated for UTI, seen in ED & had renal u/s due to left sided pain. This has resolved, however continues to have intermittent vaginal irritation, discharge-yellow to orange at times, and odor. She has been treated for yeast & BV in the past & was also treated with doxycycline in February of this year, partner treated as well, after being diagnosed with mycoplasma/ureaplasma. She reports this is worse in pregnancy but yeast/BV/vaginal.  Feeling occasional, irregular movement which is different from her other pregnancies, placenta anterior this pregnancy. Patient reports: Movement: Present Contractions: Irritability  Objective   Flow sheet Vitals: Pulse Rate: 68 BP: 99/66 Fundal Height:  (@U ) Fetal Heart Rate (bpm): 150 Total weight gain: 18 lb 12.8 oz (8.528 kg)  General Appearance  No acute distress, well appearing, and well nourished Pulmonary   Normal work of breathing Neurologic   Alert and oriented to person, place, and time Psychiatric   Mood and affect within normal limits   Assessment/Plan   Plan  28 y.o. G3P2001 at [redacted]w[redacted]d presents for follow-up OB visit. Reviewed prenatal record including previous visit note.  Recurrent vaginitis Miconazole prescription provided, self swab today & will follow up in MyChart with results.  Supervision of other normal pregnancy, antepartum Red flag warning signs & how to contact provider reviewed. MFM anatomy u/s scheduled 1/8. Reassurance offered regarding irregular movement at this point in pregnancy and that pattern should develop within next 4-6w.      No orders of the defined types were placed in this encounter.  Return in 4 weeks (on 04/05/2024) for ROB.   Future Appointments  Date Time Provider Department Center  03/17/2024  1:00 PM [facility] PROVIDER 1 [facility]  03/17/2024  1:30 PM [facility] US1  [facility]  04/05/2024  3:35 PM Leigh Sober, MD [facility] None    For next visit:  continue with routine prenatal care     Harlene LITTIE Cisco, CNM  12/30/202510:28 AM  "

## 2024-03-11 LAB — CERVICOVAGINAL ANCILLARY ONLY
Bacterial Vaginitis (gardnerella): POSITIVE — AB
Candida Glabrata: NEGATIVE
Candida Vaginitis: POSITIVE — AB
Comment: NEGATIVE
Comment: NEGATIVE
Comment: NEGATIVE

## 2024-03-14 ENCOUNTER — Ambulatory Visit: Payer: Self-pay | Admitting: Certified Nurse Midwife

## 2024-03-14 MED ORDER — CLINDAMYCIN HCL 300 MG PO CAPS: 300.0000 mg | ORAL_CAPSULE | Freq: Two times a day (BID) | ORAL | 0 refills | Status: DC

## 2024-03-16 ENCOUNTER — Encounter: Payer: Self-pay | Admitting: Obstetrics

## 2024-03-16 NOTE — Telephone Encounter (Signed)
 Please advise if you can get her seen sooner. MFM told me they are completley booked.

## 2024-03-17 ENCOUNTER — Ambulatory Visit

## 2024-03-17 ENCOUNTER — Other Ambulatory Visit: Payer: Self-pay

## 2024-03-17 ENCOUNTER — Other Ambulatory Visit

## 2024-03-18 ENCOUNTER — Other Ambulatory Visit: Payer: Self-pay | Admitting: Certified Nurse Midwife

## 2024-03-18 DIAGNOSIS — O09299 Supervision of pregnancy with other poor reproductive or obstetric history, unspecified trimester: Secondary | ICD-10-CM

## 2024-03-18 DIAGNOSIS — O34219 Maternal care for unspecified type scar from previous cesarean delivery: Secondary | ICD-10-CM

## 2024-03-18 MED ORDER — CLINDAMYCIN HCL 300 MG PO CAPS: 300.0000 mg | ORAL_CAPSULE | Freq: Two times a day (BID) | ORAL | 0 refills | Status: DC

## 2024-03-18 NOTE — Progress Notes (Signed)
 [facility] anatomy cancelled, now on waitlist, will send referral to [facility] given delay in care.

## 2024-03-18 NOTE — Addendum Note (Signed)
 Addended by: JAYNE RAISIN on: 03/18/2024 03:13 PM   Modules accepted: Orders

## 2024-03-22 ENCOUNTER — Telehealth: Payer: Self-pay | Admitting: Certified Nurse Midwife

## 2024-03-22 NOTE — Telephone Encounter (Signed)
 Spoke with patient who is aware that her referral has been faxed to [facility] and to be on the look out for a call from [place] area. Patient verbalized understanding. I spoke with Luke at [facility] who stated they have received the referral and once reviewed by a nurse the scheduler working the queue will reach out to the patient for scheduling. Also, informed patient that I will send referral to [facility] so patient can accept soonest available between the two.

## 2024-03-23 ENCOUNTER — Telehealth: Payer: Self-pay | Admitting: Obstetrics

## 2024-03-23 NOTE — Telephone Encounter (Signed)
 Spoke with kim at [facility], Patient is scheduled on Jan 26th for US , Genetic Counseling, and provider consultation (9am-11am). Spoke with patient after call and she is aware of these appointments and states that she is comfortable with that date she has been scheduled for. Patient was scheduled here in office on Jan 27th, patient has requested appointment be switched to the afternoon of Jan 26th to prevent having to take off work. Rescheduled per patient request.

## 2024-04-04 ENCOUNTER — Encounter: Admitting: Obstetrics

## 2024-04-04 DIAGNOSIS — Z3A24 24 weeks gestation of pregnancy: Secondary | ICD-10-CM

## 2024-04-04 DIAGNOSIS — Z113 Encounter for screening for infections with a predominantly sexual mode of transmission: Secondary | ICD-10-CM

## 2024-04-04 DIAGNOSIS — Z131 Encounter for screening for diabetes mellitus: Secondary | ICD-10-CM

## 2024-04-04 DIAGNOSIS — Z130 Encounter for screening for diseases of the blood and blood-forming organs and certain disorders involving the immune mechanism: Secondary | ICD-10-CM

## 2024-04-05 ENCOUNTER — Encounter: Admitting: Obstetrics

## 2024-04-06 ENCOUNTER — Ambulatory Visit: Admitting: Obstetrics & Gynecology

## 2024-04-06 VITALS — BP 104/69 | HR 67 | Wt 194.0 lb

## 2024-04-06 DIAGNOSIS — O2602 Excessive weight gain in pregnancy, second trimester: Secondary | ICD-10-CM

## 2024-04-06 DIAGNOSIS — Z3480 Encounter for supervision of other normal pregnancy, unspecified trimester: Secondary | ICD-10-CM

## 2024-04-06 DIAGNOSIS — E669 Obesity, unspecified: Secondary | ICD-10-CM

## 2024-04-06 DIAGNOSIS — Z130 Encounter for screening for diseases of the blood and blood-forming organs and certain disorders involving the immune mechanism: Secondary | ICD-10-CM

## 2024-04-06 DIAGNOSIS — Z113 Encounter for screening for infections with a predominantly sexual mode of transmission: Secondary | ICD-10-CM

## 2024-04-06 DIAGNOSIS — O99212 Obesity complicating pregnancy, second trimester: Secondary | ICD-10-CM

## 2024-04-06 DIAGNOSIS — Z3A24 24 weeks gestation of pregnancy: Secondary | ICD-10-CM

## 2024-04-06 DIAGNOSIS — O34219 Maternal care for unspecified type scar from previous cesarean delivery: Secondary | ICD-10-CM

## 2024-04-06 DIAGNOSIS — Z131 Encounter for screening for diabetes mellitus: Secondary | ICD-10-CM

## 2024-04-06 DIAGNOSIS — O99210 Obesity complicating pregnancy, unspecified trimester: Secondary | ICD-10-CM

## 2024-04-06 DIAGNOSIS — O2600 Excessive weight gain in pregnancy, unspecified trimester: Secondary | ICD-10-CM

## 2024-04-06 NOTE — Progress Notes (Signed)
" ° °  PRENATAL VISIT NOTE  Subjective:  Carly Henry is a 29 y.o. G3P2001 at [redacted]w[redacted]d being seen today for ongoing prenatal care.  She is currently monitored for the following issues for this high-risk pregnancy and has Chronic migraine w/o aura w/o status migrainosus, not intractable; Generalized anxiety disorder; Obesity (BMI 30-39.9); Previous cesarean delivery affecting pregnancy, antepartum; Bleeding hemorrhoid; Pelvic pain; Supervision of other normal pregnancy, antepartum; Anxiety; Migraine; H/O fetal cleft lip/palate in prior pregnancy, currently pregnant; and Recurrent vaginitis on their problem list.  Patient reports no complaints.  Contractions: Not present. Vag. Bleeding: None.  Movement: Present. Denies leaking of fluid.   The following portions of the patient's history were reviewed and updated as appropriate: allergies, current medications, past family history, past medical history, past social history, past surgical history and problem list.   Objective:   Vitals:   04/06/24 1605  BP: 104/69  Pulse: 67  Weight: 194 lb (88 kg)    Fetal Status:  Fetal Heart Rate (bpm): 150 Fundal Height: 26 cm Movement: Present    General: Alert, oriented and cooperative. Patient is in no acute distress.  Skin: Skin is warm and dry. No rash noted.   Cardiovascular: Normal heart rate noted  Respiratory: Normal respiratory effort, no problems with respiration noted  Abdomen: Soft, gravid, appropriate for gestational age.  Pain/Pressure: Absent     Pelvic: Cervical exam deferred        Extremities: Normal range of motion.     Mental Status: Normal mood and affect. Normal behavior. Normal judgment and thought content.      12/11/2023    1:43 PM 07/09/2017    2:05 PM  Depression screen PHQ 2/9  Decreased Interest 1 0  Down, Depressed, Hopeless 1 1  PHQ - 2 Score 2 1  Altered sleeping 0 0  Tired, decreased energy 2 1  Change in appetite 1 1  Feeling bad or failure about yourself  0 1   Trouble concentrating 0 0  Moving slowly or fidgety/restless 0 0  Suicidal thoughts 0 0  PHQ-9 Score 5  4      Data saved with a previous flowsheet row definition         No data to display          Assessment and Plan:  Pregnancy: G3P2001 at [redacted]w[redacted]d 1. Supervision of other normal pregnancy, antepartum (Primary) - She had anatomy scan with MFM in [facility] today, results not yet available  - 28 Week RH+Panel; Future  2. [redacted] weeks gestation of pregnancy  - 28 Week RH+Panel; Future  3. Screening for deficiency anemia  - 28 Week RH+Panel; Future  4. Screening for diabetes mellitus  - 28 Week RH+Panel; Future  5. Screening for STD (sexually transmitted disease)  - 28 Week RH+Panel; Future  6. Previous cesarean delivery affecting pregnancy, antepartum - she had a VBAC with her last baby (at [facility]) and would like a TOLAC  7. Obesity in pregnancy  - Protein / creatinine ratio, urine  8. Excessive weight gain affecting pregnancy - discussed the increased risk of stillbirth with increased weight gain, rec healthy eating habits and exercise  Preterm labor symptoms and general obstetric precautions including but not limited to vaginal bleeding, contractions, leaking of fluid and fetal movement were reviewed in detail with the patient. Please refer to After Visit Summary for other counseling recommendations.   No follow-ups on file.  No future appointments.  Harland JAYSON Birkenhead, MD  "

## 2024-04-07 LAB — PROTEIN / CREATININE RATIO, URINE
Creatinine, Urine: 122.5 mg/dL
Protein, Ur: 14.1 mg/dL
Protein/Creat Ratio: 115 mg/g{creat} (ref 0–200)

## 2024-04-27 ENCOUNTER — Encounter: Admitting: Certified Nurse Midwife

## 2024-04-27 ENCOUNTER — Other Ambulatory Visit
